# Patient Record
Sex: Female | Born: 1960 | Race: Black or African American | Hispanic: No | Marital: Married | Smoking: Never smoker
Health system: Southern US, Community
[De-identification: ages and names within clinical notes are randomized; demographics above are authoritative.]

## PROBLEM LIST (undated history)

## (undated) DIAGNOSIS — E119 Type 2 diabetes mellitus without complications: Secondary | ICD-10-CM

## (undated) DIAGNOSIS — I10 Essential (primary) hypertension: Secondary | ICD-10-CM

## (undated) DIAGNOSIS — R011 Cardiac murmur, unspecified: Secondary | ICD-10-CM

## (undated) DIAGNOSIS — K219 Gastro-esophageal reflux disease without esophagitis: Secondary | ICD-10-CM

## (undated) DIAGNOSIS — E049 Nontoxic goiter, unspecified: Secondary | ICD-10-CM

## (undated) HISTORY — PX: ABDOMINAL HYSTERECTOMY: SHX81

## (undated) HISTORY — DX: Gastro-esophageal reflux disease without esophagitis: K21.9

## (undated) HISTORY — PX: OVARIAN CYST REMOVAL: SHX89

## (undated) HISTORY — PX: BREAST CYST EXCISION: SHX579

## (undated) HISTORY — PX: LAPAROTOMY: SHX154

## (undated) HISTORY — PX: OOPHORECTOMY: SHX86

---

## 2004-11-07 ENCOUNTER — Ambulatory Visit: Payer: Self-pay | Admitting: Family Medicine

## 2005-04-25 ENCOUNTER — Ambulatory Visit: Payer: Self-pay | Admitting: Family Medicine

## 2006-01-04 ENCOUNTER — Ambulatory Visit: Payer: Self-pay | Admitting: Otolaryngology

## 2006-04-22 ENCOUNTER — Ambulatory Visit: Payer: Self-pay | Admitting: Gynecology

## 2006-10-01 ENCOUNTER — Ambulatory Visit: Payer: Self-pay | Admitting: Family Medicine

## 2007-08-27 ENCOUNTER — Ambulatory Visit: Payer: Self-pay | Admitting: Obstetrics & Gynecology

## 2007-09-04 ENCOUNTER — Encounter: Admission: RE | Admit: 2007-09-04 | Discharge: 2007-09-04 | Payer: Self-pay | Admitting: Gynecology

## 2007-09-12 ENCOUNTER — Ambulatory Visit: Payer: Self-pay | Admitting: Gynecology

## 2007-09-24 ENCOUNTER — Encounter: Admission: RE | Admit: 2007-09-24 | Discharge: 2007-09-24 | Payer: Self-pay | Admitting: Gynecology

## 2008-01-21 ENCOUNTER — Ambulatory Visit: Payer: Self-pay | Admitting: Unknown Physician Specialty

## 2008-03-24 ENCOUNTER — Encounter: Admission: RE | Admit: 2008-03-24 | Discharge: 2008-03-24 | Payer: Self-pay | Admitting: Gynecology

## 2008-08-26 ENCOUNTER — Ambulatory Visit: Payer: Self-pay | Admitting: Gastroenterology

## 2008-09-05 ENCOUNTER — Ambulatory Visit: Payer: Self-pay | Admitting: Gynecologic Oncology

## 2008-09-22 ENCOUNTER — Inpatient Hospital Stay: Payer: Self-pay | Admitting: Obstetrics & Gynecology

## 2008-09-29 ENCOUNTER — Ambulatory Visit: Payer: Self-pay | Admitting: Obstetrics & Gynecology

## 2008-10-02 ENCOUNTER — Inpatient Hospital Stay: Payer: Self-pay | Admitting: Obstetrics & Gynecology

## 2009-08-25 ENCOUNTER — Ambulatory Visit: Payer: Self-pay | Admitting: Obstetrics & Gynecology

## 2009-09-15 ENCOUNTER — Ambulatory Visit: Payer: Self-pay | Admitting: Surgery

## 2009-09-22 ENCOUNTER — Ambulatory Visit: Payer: Self-pay | Admitting: Surgery

## 2009-09-22 HISTORY — PX: BREAST BIOPSY: SHX20

## 2010-03-29 ENCOUNTER — Ambulatory Visit: Payer: Self-pay | Admitting: Surgery

## 2010-04-24 IMAGING — US US NEEDLE LOCALIZATION*L*
1 series · 6 of 6 positions shown · non-contrast
Comparison: none

REASON FOR EXAM: nodule
COMMENTS:

[Series 1: us needle localization*left* · 6 of 6 slices shown]
[im 1/6]
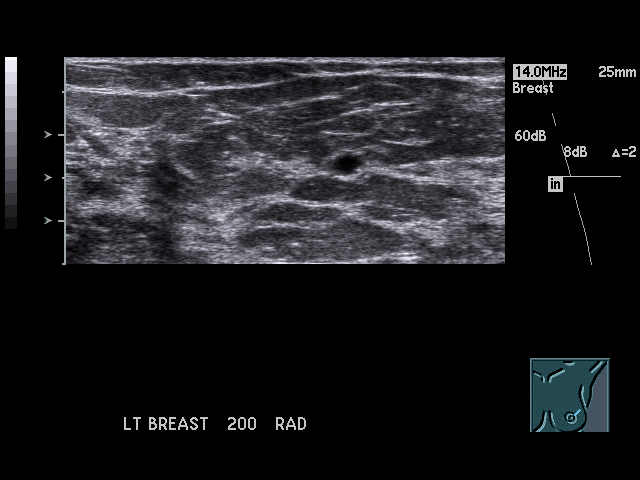
[im 2/6]
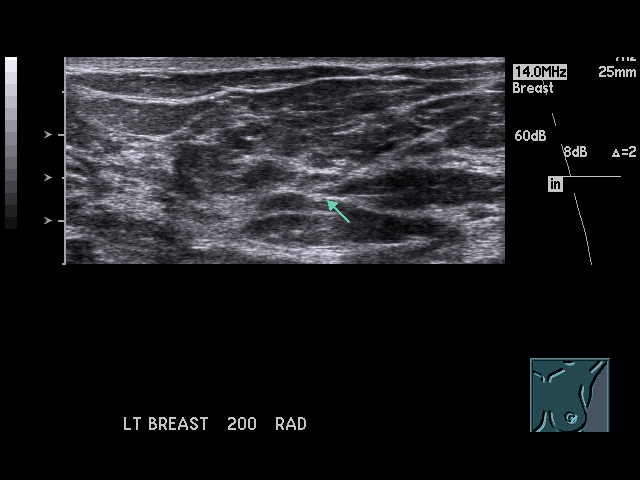
[im 3/6]
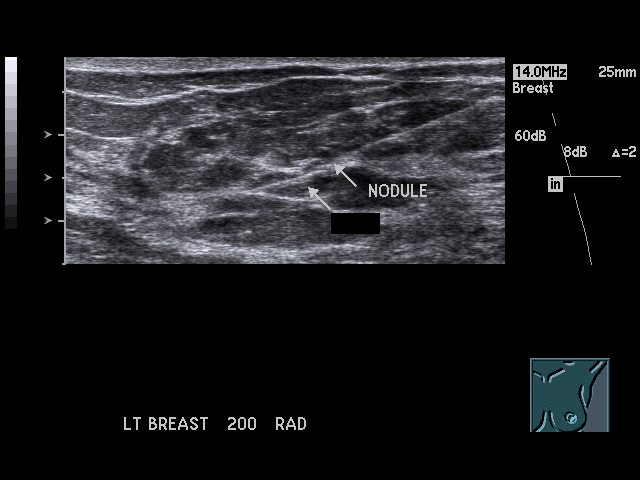
[im 4/6]
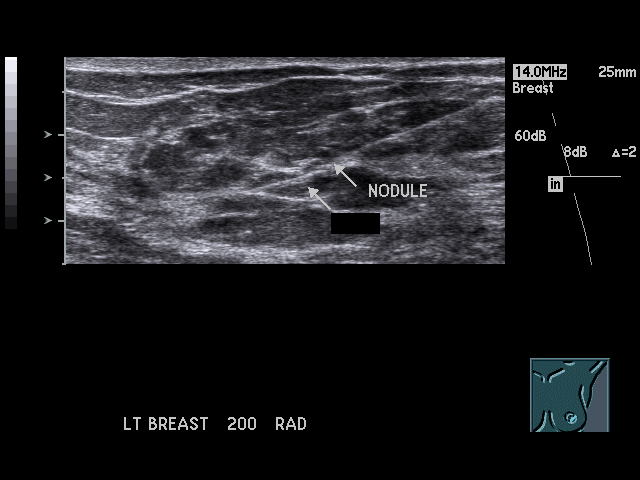
[im 5/6]
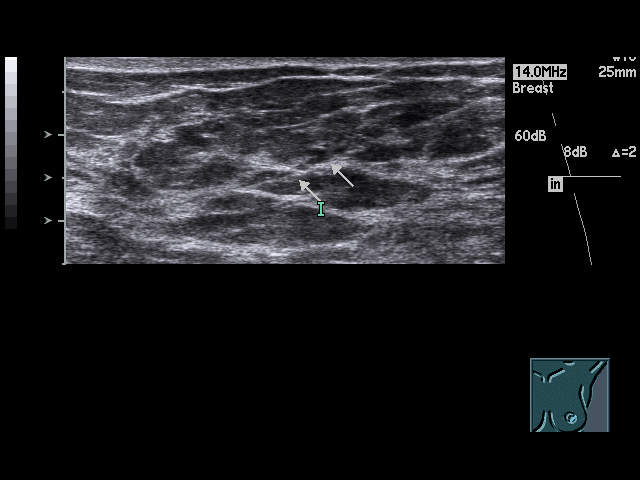
[im 6/6]
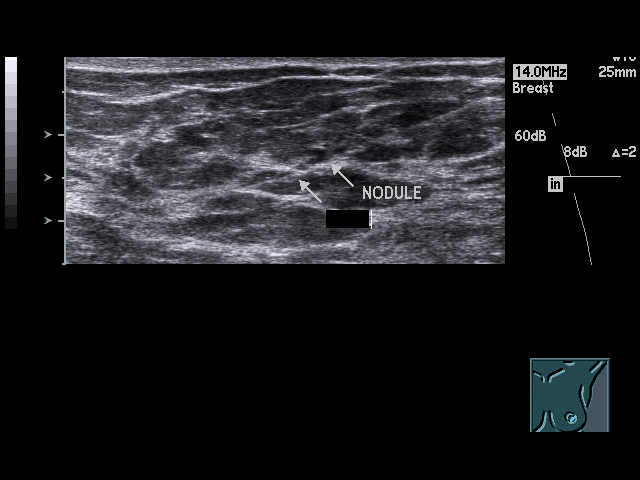

[6 of 6 positions shown; findings below may reference images not displayed]

PROCEDURE:     US  - US GUIDED NEEDLE LOCAL L BREAST  - September 22, 2009  [DATE]

RESULT:

The patient was informed of the risks and benefits of the procedure and
proper informed consent was obtained. The patient was brought to the
Ultrasound Suite and placed in a supine position. The left breast was
evaluated. A small hypoechoic nodule was once again identified. Proper entry
site for ultrasound-guided needle localization was established. The
overlying soft tissues were then prepped and draped in the usual sterile
fashion. Utilizing 4 ml of 1% lidocaine without epinephrine the overlying
soft tissues were anesthetized. Utilizing a 9 cm entry Kopans needle under
ultrasound guidance the hypoechoic nodule was cannulated. There is partial
collapse of the nodule status post cannulation likely representing a cystic
component. The overlying soft tissues were anesthetized with 4 ml of
lidocaine without epinephrine. A modified Kopans wire was placed through the
entry needle. Needle and wire placement was performed utilizing ultrasound
guidance. The entry needle was removed. The modified Kopans wire remained
with the hook just beyond the hypoechoic nodule and thickening within the
nodule. The patient tolerated the procedure without complications and was
then taken to surgery for biopsy.
IMPRESSION: Ultrasound guided needle localization as described above.
The patient tolerated the procedure without complications.

## 2010-06-23 ENCOUNTER — Emergency Department: Payer: Self-pay | Admitting: Emergency Medicine

## 2010-06-29 ENCOUNTER — Ambulatory Visit: Payer: Self-pay | Admitting: Family Medicine

## 2010-10-19 ENCOUNTER — Ambulatory Visit: Payer: Self-pay | Admitting: Obstetrics & Gynecology

## 2011-11-22 ENCOUNTER — Ambulatory Visit: Payer: Self-pay | Admitting: Obstetrics & Gynecology

## 2013-12-11 ENCOUNTER — Ambulatory Visit: Payer: Self-pay | Admitting: Obstetrics & Gynecology

## 2014-02-19 ENCOUNTER — Ambulatory Visit: Payer: Self-pay | Admitting: Gastroenterology

## 2014-02-22 LAB — PATHOLOGY REPORT

## 2014-03-09 ENCOUNTER — Ambulatory Visit: Payer: Self-pay

## 2014-04-05 ENCOUNTER — Ambulatory Visit: Payer: Self-pay

## 2014-05-24 ENCOUNTER — Ambulatory Visit: Payer: Self-pay | Admitting: Gastroenterology

## 2014-06-03 LAB — PATHOLOGY REPORT

## 2016-03-12 ENCOUNTER — Encounter: Payer: Self-pay | Admitting: *Deleted

## 2016-03-12 ENCOUNTER — Ambulatory Visit (INDEPENDENT_AMBULATORY_CARE_PROVIDER_SITE_OTHER): Payer: BLUE CROSS/BLUE SHIELD

## 2016-03-12 ENCOUNTER — Ambulatory Visit
Admission: EM | Admit: 2016-03-12 | Discharge: 2016-03-12 | Disposition: A | Discharge status: Home / Self Care | Payer: BLUE CROSS/BLUE SHIELD | Attending: Family Medicine | Admitting: Family Medicine

## 2016-03-12 DIAGNOSIS — S91342A Puncture wound with foreign body, left foot, initial encounter: Secondary | ICD-10-CM | POA: Diagnosis not present

## 2016-03-12 DIAGNOSIS — S91302A Unspecified open wound, left foot, initial encounter: Secondary | ICD-10-CM | POA: Diagnosis not present

## 2016-03-12 HISTORY — DX: Type 2 diabetes mellitus without complications: E11.9

## 2016-03-12 HISTORY — DX: Essential (primary) hypertension: I10

## 2016-03-12 MED ORDER — CEPHALEXIN 500 MG PO CAPS: 500.0000 mg | ORAL_CAPSULE | Freq: Three times a day (TID) | ORAL | Status: DC

## 2016-03-12 MED ORDER — TETANUS-DIPHTH-ACELL PERTUSSIS 5-2.5-18.5 LF-MCG/0.5 IM SUSP
0.5000 mL | Freq: Once | INTRAMUSCULAR | Status: AC
  Administered 2016-03-12: 0.5 mL via INTRAMUSCULAR

## 2016-03-12 NOTE — ED Provider Notes (Signed)
[facility]  ____________________________________________  Time seen: Approximately 11:49 AM  I have reviewed the triage vital signs and the nursing notes.   HISTORY  Chief Complaint Foot Injury   HPI April Webb is a 55 y.o. female presents with a complaint of possible piece of metal in the bottom of her left foot. Patient reports that this occurred on Saturday. Patient states that her husband usually wears work boots and works in a [employer] laboratory technician, and states that he walks to the house and his foods causing pieces of metal to be on the floor. Patient states that she then walked to the kitchen barefoot and felt a piece of what she assumed to be metal go into her foot. States that the metal pieces were very small and that this was a superficial injury. States there was no bleeding initially until she tried to search the wound for foreign body. Denies fall to the ground. Denies any other pain or injuries.  Patient states that she did clean the area multiple times with soap and water. Patient states that she used tweezers and felt like she pulled a small piece of metal out of her foot but concern that there may still be another piece. Patient states that the area that she pulled out was very small and does not feel like it when indeed. Again states that she was barefoot during this and reports that she is a diabetic.  States no pain to the foot at this time. States area directly around puncture site is minimally tender. Denies any drainage, fevers, pain radiation, none numbness or tingling sensation. Denies decreased range of motion. Denies any other pain or injury. Unsure of last tetanus immunization.  Past Medical History  Diagnosis Date  . Diabetes mellitus without complication ([place])   . Hypertension     There are no active problems to display for this patient.   Past Surgical History  Procedure Laterality Date  . Abdominal hysterectomy      Current Outpatient  Rx  Name  Route  Sig  Dispense  Refill  . hydrochlorothiazide (MICROZIDE) 12.5 MG capsule   Oral   Take 12.5 mg by mouth daily.         . metFORMIN (GLUCOPHAGE) 500 MG tablet   Oral   Take by mouth 2 (two) times daily with a meal.           Allergies Review of patient's allergies indicates no known allergies.  History reviewed. No pertinent family history.  Social History Social History  Substance Use Topics  . Smoking status: Never Smoker   . Smokeless tobacco: None  . Alcohol Use: No    Review of Systems Constitutional: No fever/chills Eyes: No visual changes. ENT: No sore throat. Cardiovascular: Denies chest pain. Respiratory: Denies shortness of breath. Gastrointestinal: No abdominal pain.  No nausea, no vomiting.  No diarrhea.  No constipation. Genitourinary: Negative for dysuria. Musculoskeletal: Negative for back pain. Skin: Negative for rash.As above. Neurological: Negative for headaches, focal weakness or numbness.  10-point ROS otherwise negative.  ____________________________________________   PHYSICAL EXAM:  VITAL SIGNS: ED Triage Vitals  Enc Vitals Group     BP 03/12/16 1043 149/78 mmHg     Pulse Rate 03/12/16 1043 79     Resp 03/12/16 1043 16     Temp 03/12/16 1043 98.1 F (36.7 C)     Temp Source 03/12/16 1043 Oral     SpO2 03/12/16 1043 100 %  Weight 03/12/16 1043 186 lb (84.369 kg)     Height 03/12/16 1043 5\' 6"  (1.676 m)     Head Cir --      Peak Flow --      Pain Score --      Pain Loc --      Pain Edu? --      Excl. in [place]? --     Constitutional: Alert and oriented. Well appearing and in no acute distress. Eyes: Conjunctivae are normal. PERRL. EOMI. Head: Atraumatic.  Nose: No congestion/rhinnorhea.  Mouth/Throat: Mucous membranes are moist.   Neck: No stridor.  No cervical spine tenderness to palpation. Cardiovascular: Normal rate, regular rhythm. Grossly normal heart sounds.  Good peripheral circulation. Respiratory:  Normal respiratory effort.  No retractions. Lungs CTAB. Gastrointestinal: Soft and nontender. Musculoskeletal: No lower or upper extremity tenderness nor edema. Bilateral pedal pulses equal and easily palpated. See skin below. Neurologic:  Normal speech and language. No gross focal neurologic deficits are appreciated. No gait instability. Skin:  Skin is warm, dry and intact. No rash noted. Except : Left plantar foot over the ball of the foot very small superficial-appearing blackish foreign body visualized, no surrounding erythema, minimal tenderness directly at site, no induration, no fluctuance, no drainage, no other tenderness, full range of motion to left foot, capillary refill less than 2 seconds to the left foot all distal toes. Steady gait. Bilateral lower extremities nontender to palpation except for minimally directly over foreign body site. Psychiatric: Mood and affect are normal. Speech and behavior are normal.  ____________________________________________   LABS (all labs ordered are listed, but only abnormal results are displayed)  Labs Reviewed - No data to display  RADIOLOGY   EXAM: LEFT FOOT - COMPLETE 3+ VIEW  COMPARISON: None.  FINDINGS: There is no visible foreign body in the soft tissues. 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  Small calcaneal enthesophytes. Minimal dorsal spurring in the midfoot. Anatomic variant of an os naviculare.  IMPRESSION: No visible foreign body. Arthritic changes.   Electronically Signed By: Lorriane Shire M.D. On: 03/12/2016 11:46  I, April Webb, personally viewed and evaluated these images (plain radiographs) as part of my medical decision making, as well as reviewing the written report by the radiologist.  ____________________________________________   PROCEDURES  Procedure(s) performed:   Procedure(s) performed:  Procedure  explained and verbal consent obtained. Consent: Verbal consent obtained. Written consent not obtained. Risks and benefits: risks, benefits and alternatives were discussed Patient identity confirmed: verbally with patient and hospital-assigned identification number  Consent given by: patient   Foreign body removal Location: Left plantar foot beneath first MCP Tendon involvement: none Nerve involvement: none Preparation: Patient was prepped and draped in the usual sterile fashion. Anesthesia: Patient denies anesthesia. Irrigation solution: saline and Betadine  Irrigation method: jet lavage Amount of cleaning: copious Sterile forceps and #11 blade used to make a very superficial incision made to remove small fragmented black foreign body approximately 1 mm Wound area then irrigated with saline again. Post removal and irrigation no further foreign bodies visualized. Patient reports tenderness to site immediately improved post foreign body removal. No repair of wound indicated. Antibiotic ointment and dressing applied.  Wound care instructions provided.  Observe for any signs of infection or other problems.     ____________________________________________   INITIAL IMPRESSION / ASSESSMENT AND PLAN / ED COURSE  Pertinent labs & imaging results that  were available during my care of the patient were reviewed by me and considered in my medical decision making (see chart for details).  Very well-appearing patient. No acute distress. Presents for complaint of concern of foreign body in left plantar foot. Left foot x-ray no visible foreign body, arthritic changes per radiologist. Patient had very small superficial foreign body present to left plantar foot without surrounding erythema, swelling or drainage. Small foreign body removed. Discussed keeping clean, topical antibiotic. Will treat patient with oral cephalexin and instructed to keep clean. Tetanus immunization updated.  Discussed follow up  with Primary care physician this week. Discussed follow up and return parameters including no resolution or any worsening concerns. Patient verbalized understanding and agreed to plan.   ____________________________________________   FINAL CLINICAL IMPRESSION(S) / ED DIAGNOSES  Final diagnoses:  Puncture wound of left foot with foreign body, initial encounter      Note: This dictation was prepared with Dragon dictation along with smaller phrase technology. Any transcriptional errors that result from this process are unintentional.    April Land, NP 03/12/16 1751

## 2016-03-12 NOTE — Discharge Instructions (Signed)
Take medication as prescribed. Keep clean. Apply topical antibiotic as discussed.   Follow up with your primary care physician this week as needed. Return to Urgent care for new or worsening concerns.    Puncture Wound A puncture wound is an injury that is caused by a sharp, thin object that goes through (penetrates) your skin. Usually, a puncture wound does not leave a large opening in your skin, so it may not bleed a lot. However, when you get a puncture wound, dirt or other materials (foreign bodies) can be forced into your wound and break off inside. This increases the chance of infection, such as tetanus. CAUSES Puncture wounds are caused by any sharp, thin object that goes through your skin, such as:  Animal teeth, as with an animal bite.  Sharp, pointed objects, such as nails, splinters of glass, fishhooks, and needles. SYMPTOMS Symptoms of a puncture wound include:  Pain.  Bleeding.  Swelling.  Bruising.  Fluid leaking from the wound.  Numbness, tingling, or loss of function. DIAGNOSIS This condition is diagnosed with a medical history and physical exam. Your wound will be checked to see if it contains any foreign bodies. You may also have X-rays or other imaging tests. TREATMENT Treatment for a puncture wound depends on how serious the wound is. It also depends on whether the wound contains any foreign bodies. Treatment for all types of puncture wounds usually starts with:  Controlling the bleeding.  Washing out the wound with a germ-free (sterile) salt-water solution.  Checking the wound for foreign bodies. Treatment may also include:  Having the wound opened surgically to remove a foreign object.  Closing the wound with stitches (sutures) if it continues to bleed.  Covering the wound with antibiotic ointments and a bandage (dressing).  Receiving a tetanus shot.  Receiving a rabies vaccine. HOME CARE INSTRUCTIONS Medicines  Take or apply over-the-counter  and prescription medicines only as told by your health care provider.  If you were prescribed an antibiotic, take or apply it as told by your health care provider. Do not stop using the antibiotic even if your condition improves. Wound Care  There are many ways to close and cover a wound. For example, a wound can be covered with sutures, skin glue, or adhesive strips. Follow instructions from your health care provider about:  How to take care of your wound.  When and how you should change your dressing.  When you should remove your dressing.  Removing whatever was used to close your wound.  Keep the dressing dry as told by your health care provider. Do not take baths, swim, use a hot tub, or do anything that would put your wound underwater until your health care provider approves.  Clean the wound as told by your health care provider.  Do not scratch or pick at the wound.  Check your wound every day for signs of infection. Watch for:  Redness, swelling, or pain.  Fluid, blood, or pus. General Instructions  Raise (elevate) the injured area above the level of your heart while you are sitting or lying down.  If your puncture wound is in your foot, ask your health care provider if you need to avoid putting weight on your foot and for how long.  Keep all follow-up visits as told by your health care provider. This is important. SEEK MEDICAL CARE IF:  You received a tetanus shot and you have swelling, severe pain, redness, or bleeding at the injection site.  You have  a fever.  Your sutures come out.  You notice a bad smell coming from your wound or your dressing.  You notice something coming out of your wound, such as wood or glass.  Your pain is not controlled with medicine.  You have increased redness, swelling, or pain at the site of your wound.  You have fluid, blood, or pus coming from your wound.  You notice a change in the color of your skin near your wound.  You  need to change the dressing frequently due to fluid, blood, or pus draining from your wound.  You develop a new rash.  You develop numbness around your wound. SEEK IMMEDIATE MEDICAL CARE IF:  You develop severe swelling around your wound.  Your pain suddenly increases and is severe.  You develop painful skin lumps.  You have a red streak going away from your wound.  The wound is on your hand or foot and you cannot properly move a finger or toe.  The wound is on your hand or foot and you notice that your fingers or toes look pale or bluish.   This information is not intended to replace advice given to you by your health care provider. Make sure you discuss any questions you have with your health care provider.   Document Released: 08/01/2005 Document Revised: 07/13/2015 Document Reviewed: 12/15/2014 Elsevier Interactive Patient Education 2016 [place] Taking care of your wound properly can help to prevent pain and infection. It can also help your wound to heal more quickly.  HOW TO CARE FOR YOUR WOUND  Take or apply over-the-counter and prescription medicines only as told by your health care provider.  If you were prescribed antibiotic medicine, take or apply it as told by your health care provider. Do not stop using the antibiotic even if your condition improves.  Clean the wound each day or as told by your health care provider.  Wash the wound with mild soap and water.  Rinse the wound with water to remove all soap.  Pat the wound dry with a clean towel. Do not rub it.  There are many different ways to close and cover a wound. For example, a wound can be covered with stitches (sutures), skin glue, or adhesive strips. Follow instructions from your health care provider about:  How to take care of your wound.  When and how you should change your bandage (dressing).  When you should remove your dressing.  Removing whatever was used to close your  wound.  Check your wound every day for signs of infection. Watch for:  Redness, swelling, or pain.  Fluid, blood, or pus.  Keep the dressing dry until your health care provider says it can be removed. Do not take baths, swim, use a hot tub, or do anything that would put your wound underwater until your health care provider approves.  Raise (elevate) the injured area above the level of your heart while you are sitting or lying down.  Do not scratch or pick at the wound.  Keep all follow-up visits as told by your health care provider. This is important. SEEK MEDICAL CARE IF:  You received a tetanus shot and you have swelling, severe pain, redness, or bleeding at the injection site.  You have a fever.  Your pain is not controlled with medicine.  You have increased redness, swelling, or pain at the site of your wound.  You have fluid, blood, or pus coming from your wound.  You  notice a bad smell coming from your wound or your dressing. SEEK IMMEDIATE MEDICAL CARE IF:  You have a red streak going away from your wound.   This information is not intended to replace advice given to you by your health care provider. Make sure you discuss any questions you have with your health care provider.   Document Released: 07/31/2008 Document Revised: 03/08/2015 Document Reviewed: 10/18/2014 Elsevier Interactive Patient Education [employer].

## 2016-03-12 NOTE — ED Notes (Signed)
Possible piece of metal in sole of left foot.

## 2016-04-17 DIAGNOSIS — M25541 Pain in joints of right hand: Secondary | ICD-10-CM | POA: Diagnosis not present

## 2016-04-27 DIAGNOSIS — N898 Other specified noninflammatory disorders of vagina: Secondary | ICD-10-CM | POA: Diagnosis not present

## 2016-04-27 DIAGNOSIS — N760 Acute vaginitis: Secondary | ICD-10-CM | POA: Diagnosis not present

## 2016-06-13 DIAGNOSIS — N760 Acute vaginitis: Secondary | ICD-10-CM | POA: Diagnosis not present

## 2016-06-13 DIAGNOSIS — N898 Other specified noninflammatory disorders of vagina: Secondary | ICD-10-CM | POA: Diagnosis not present

## 2017-02-28 ENCOUNTER — Telehealth: Payer: Self-pay | Admitting: Obstetrics & Gynecology

## 2017-02-28 DIAGNOSIS — E119 Type 2 diabetes mellitus without complications: Secondary | ICD-10-CM

## 2017-02-28 DIAGNOSIS — I10 Essential (primary) hypertension: Secondary | ICD-10-CM

## 2017-02-28 MED ORDER — METFORMIN HCL 500 MG PO TABS: 500.0000 mg | ORAL_TABLET | Freq: Two times a day (BID) | ORAL | 1 refills | Status: DC

## 2017-02-28 MED ORDER — HYDROCHLOROTHIAZIDE 12.5 MG PO CAPS: 12.5000 mg | ORAL_CAPSULE | Freq: Every day | ORAL | 1 refills | Status: DC

## 2017-02-28 NOTE — Telephone Encounter (Signed)
Patient scheduled her Annual for May 22, However she is in need of a refill on her bp meds.  Please advise pt.

## 2017-03-26 ENCOUNTER — Ambulatory Visit: Payer: Self-pay | Admitting: Obstetrics & Gynecology

## 2017-04-24 ENCOUNTER — Ambulatory Visit (INDEPENDENT_AMBULATORY_CARE_PROVIDER_SITE_OTHER): Payer: BLUE CROSS/BLUE SHIELD | Admitting: Obstetrics & Gynecology

## 2017-04-24 ENCOUNTER — Encounter: Payer: Self-pay | Admitting: Obstetrics & Gynecology

## 2017-04-24 VITALS — BP 130/80 | HR 74 | Ht 66.0 in | Wt 191.0 lb

## 2017-04-24 DIAGNOSIS — Z01419 Encounter for gynecological examination (general) (routine) without abnormal findings: Secondary | ICD-10-CM | POA: Diagnosis not present

## 2017-04-24 DIAGNOSIS — Z0000 Encounter for general adult medical examination without abnormal findings: Secondary | ICD-10-CM

## 2017-04-24 DIAGNOSIS — Z1239 Encounter for other screening for malignant neoplasm of breast: Secondary | ICD-10-CM

## 2017-04-24 DIAGNOSIS — Z1231 Encounter for screening mammogram for malignant neoplasm of breast: Secondary | ICD-10-CM | POA: Diagnosis not present

## 2017-04-24 DIAGNOSIS — N952 Postmenopausal atrophic vaginitis: Secondary | ICD-10-CM

## 2017-04-24 DIAGNOSIS — Z1211 Encounter for screening for malignant neoplasm of colon: Secondary | ICD-10-CM | POA: Diagnosis not present

## 2017-04-24 DIAGNOSIS — Z1329 Encounter for screening for other suspected endocrine disorder: Secondary | ICD-10-CM

## 2017-04-24 DIAGNOSIS — Z1321 Encounter for screening for nutritional disorder: Secondary | ICD-10-CM

## 2017-04-24 NOTE — Progress Notes (Signed)
HPI:      [name] is a 56 y.o. [phone] who LMP was in the past, she presents today for her annual examination.  The patient has no complaints today. The patient is sexually active. Herlast pap: approximate date 2016 and was normal and last mammogram: approximate date 2017 and was normal.  The patient does perform self breast exams.  There is no notable family history of breast or ovarian cancer in her family. The patient is not taking hormone replacement therapy. Patient denies post-menopausal vaginal bleeding.   The patient has regular exercise: yes. The patient denies current symptoms of depression.  Vag dryness and dyspareunia  GYN Hx: Last Colonoscopy:4 years ago. Normal.  Last DEXA: never ago.    PMHx: Past Medical History:  Diagnosis Date  . Diabetes mellitus without complication ([place])   . Hypertension    Past Surgical History:  Procedure Laterality Date  . ABDOMINAL HYSTERECTOMY    . CESAREAN SECTION    . LAPAROTOMY    . OOPHORECTOMY    . OVARIAN CYST REMOVAL     Family History  Problem Relation Age of Onset  . Colon cancer Mother   . Hypertension Father   . Prostate cancer Father    Social History  Substance Use Topics  . Smoking status: Never Smoker  . Smokeless tobacco: Never Used  . Alcohol use No    Current Outpatient Prescriptions:  .  glucose blood (ONETOUCH VERIO) test strip, Use once daily. Use as instructed., Disp: , Rfl:  .  hydrochlorothiazide (MICROZIDE) 12.5 MG capsule, Take 1 capsule (12.5 mg total) by mouth daily., Disp: 30 capsule, Rfl: 1 .  cephALEXin (KEFLEX) 500 MG capsule, Take 1 capsule (500 mg total) by mouth 3 (three) times daily. (Patient not taking: Reported on 04/24/2017), Disp: 21 capsule, Rfl: 0 .  metFORMIN (GLUCOPHAGE) 500 MG tablet, Take 1 tablet (500 mg total) by mouth 2 (two) times daily with a meal., Disp: 60 tablet, Rfl: 1 Allergies: Patient has no known allergies.  Review of Systems  Constitutional: Negative for  chills, fever and malaise/fatigue.  HENT: Negative for congestion, sinus pain and sore throat.   Eyes: Negative for blurred vision and pain.  Respiratory: Negative for cough and wheezing.   Cardiovascular: Negative for chest pain and leg swelling.  Gastrointestinal: Negative for abdominal pain, constipation, diarrhea, heartburn, nausea and vomiting.  Genitourinary: Negative for dysuria, frequency, hematuria and urgency.  Musculoskeletal: Negative for back pain, joint pain, myalgias and neck pain.  Skin: Negative for itching and rash.  Neurological: Negative for dizziness, tremors and weakness.  Endo/Heme/Allergies: Does not bruise/bleed easily.  Psychiatric/Behavioral: Negative for depression. The patient is not nervous/anxious and does not have insomnia.     Objective: BP 130/80   Pulse 74   Ht 5\' 6"  (1.676 m)   Wt 191 lb (86.6 kg)   BMI 30.83 kg/m   Filed Weights   04/24/17 0848  Weight: 191 lb (86.6 kg)   Body mass index is 30.83 kg/m. Physical Exam  Constitutional: She is oriented to person, place, and time. She appears well-developed and well-nourished. No distress.  Genitourinary: Rectum normal and vagina normal. Pelvic exam was performed with patient supine. There is no rash or lesion on the right labia. There is no rash or lesion on the left labia. Vagina exhibits no lesion. No bleeding in the vagina. Right adnexum does not display mass and does not display tenderness. Left adnexum does not display mass and does not display  tenderness.  Genitourinary Comments: Absent Uterus Absent cervix Vaginal cuff well healed, atrohy present  HENT:  Head: Normocephalic and atraumatic. Head is without laceration.  Right Ear: Hearing normal.  Left Ear: Hearing normal.  Nose: No epistaxis.  No foreign bodies.  Mouth/Throat: Uvula is midline, oropharynx is clear and moist and mucous membranes are normal.  Eyes: Pupils are equal, round, and reactive to light.  Neck: Normal range of  motion. Neck supple. No thyromegaly present.  Cardiovascular: Normal rate and regular rhythm.  Exam reveals no gallop and no friction rub.   No murmur heard. Pulmonary/Chest: Effort normal and breath sounds normal. No respiratory distress. She has no wheezes. Right breast exhibits no mass, no skin change and no tenderness. Left breast exhibits no mass, no skin change and no tenderness.  Abdominal: Soft. Bowel sounds are normal. She exhibits no distension. There is no tenderness. There is no rebound.  Musculoskeletal: Normal range of motion.  Neurological: She is alert and oriented to person, place, and time. No cranial nerve deficit.  Skin: Skin is warm and dry.  Psychiatric: She has a normal mood and affect. Judgment normal.  Vitals reviewed.   Assessment: Annual Exam 1. Annual physical exam   2. Screen for colon cancer   3. Encounter for vitamin deficiency screening   4. Screening for breast cancer   5. Vaginal atrophy   6. Screening for thyroid disorder     Plan:            1.  Cervical Screening-  Pap smear schedule reviewed with patient  2. Breast screening- Exam annually and mammogram scheduled  3. Colonoscopy every 5 years, Hemoccult testing after age 87  4. Labs Ordered today  5. Counseling for hormonal therapy: none.  Replens and other options for vag dryness discussed    F/U  Return in about 1 year (around 04/24/2018) for Annual.  Barnett Applebaum, MD, [facility], [facility] 04/24/2017  9:26 AM

## 2017-04-24 NOTE — Patient Instructions (Signed)
PAP every 5 years Mammogram every year Colonoscopy every 10 years Labs today  [place] for mammogram [phone]  REPLENS twice weekly for vaginal therapy

## 2017-04-25 ENCOUNTER — Encounter: Payer: Self-pay | Admitting: Obstetrics & Gynecology

## 2017-04-25 LAB — TSH: TSH: 1.18 u[IU]/mL (ref 0.450–4.500)

## 2017-04-25 LAB — VITAMIN B12: Vitamin B-12: 384 pg/mL (ref 232–1245)

## 2017-04-25 LAB — VITAMIN D 25 HYDROXY (VIT D DEFICIENCY, FRACTURES): Vit D, 25-Hydroxy: 10.4 ng/mL — ABNORMAL LOW (ref 30.0–100.0)

## 2017-05-03 DIAGNOSIS — Z1211 Encounter for screening for malignant neoplasm of colon: Secondary | ICD-10-CM | POA: Diagnosis not present

## 2017-05-04 LAB — PLEASE NOTE

## 2017-05-04 LAB — FECAL OCCULT BLOOD, IMMUNOCHEMICAL: Fecal Occult Bld: NEGATIVE

## 2017-05-06 ENCOUNTER — Other Ambulatory Visit: Payer: Self-pay | Admitting: Obstetrics & Gynecology

## 2017-05-06 MED ORDER — VITAMIN D (ERGOCALCIFEROL) 1.25 MG (50000 UNIT) PO CAPS: 50000.0000 [IU] | ORAL_CAPSULE | ORAL | 2 refills | Status: DC

## 2017-05-16 ENCOUNTER — Other Ambulatory Visit: Payer: Self-pay | Admitting: Obstetrics & Gynecology

## 2017-05-16 DIAGNOSIS — I10 Essential (primary) hypertension: Secondary | ICD-10-CM

## 2017-05-16 DIAGNOSIS — E119 Type 2 diabetes mellitus without complications: Secondary | ICD-10-CM

## 2017-05-16 MED ORDER — METFORMIN HCL 500 MG PO TABS: 500.0000 mg | ORAL_TABLET | Freq: Two times a day (BID) | ORAL | 11 refills | Status: DC

## 2017-05-16 NOTE — Telephone Encounter (Signed)
What about the Metformin?

## 2017-05-16 NOTE — Telephone Encounter (Signed)
Pt is calling needing an refill on her Hydrochlorothiazide. 12.5 MG. [facility] in [place],

## 2017-05-16 NOTE — Telephone Encounter (Signed)
Approved Medications  hydrochlorothiazide (MICROZIDE) 12.5 MG capsule TAKE 1 CAPSULE BY MOUTH ONCE DAILY     Disp: 90 capsule    Refills: 3 (Pharmacy requested: 1)      Class: Normal Start: 05/16/2017  For: Hypertension, unspecified type Approved by: Gae Dry, MD To pharmacy: Please consider 90 day supplies to promote better adherence To be filled at: [facility] [address], [place]: [phone] Medication Refill   Gae Dry, MD routed conversation to You 12 minutes ago (10:07 AM)    You routed conversation to Gae Dry, MD 1 hour ago (8:55 AM)    Interface, Surescripts Out routed conversation to [employer] [facility] Refill 1 hour ago (8:30 AM)

## 2017-05-16 NOTE — Telephone Encounter (Signed)
Whats the question? I approved HCTZ this morning to her pharmacy.

## 2017-05-16 NOTE — Telephone Encounter (Signed)
That was not requested form the pharmacy.  Will see about re-prescribing now.

## 2017-05-31 ENCOUNTER — Other Ambulatory Visit: Payer: Self-pay

## 2017-10-17 ENCOUNTER — Ambulatory Visit
Admission: RE | Admit: 2017-10-17 | Discharge: 2017-10-17 | Disposition: A | Discharge status: Home / Self Care | Payer: BLUE CROSS/BLUE SHIELD | Source: Ambulatory Visit | Attending: Obstetrics & Gynecology | Admitting: Obstetrics & Gynecology

## 2017-10-17 DIAGNOSIS — Z1239 Encounter for other screening for malignant neoplasm of breast: Secondary | ICD-10-CM

## 2017-10-17 DIAGNOSIS — Z1231 Encounter for screening mammogram for malignant neoplasm of breast: Secondary | ICD-10-CM | POA: Insufficient documentation

## 2017-11-04 ENCOUNTER — Other Ambulatory Visit: Payer: Self-pay | Admitting: *Deleted

## 2017-11-04 ENCOUNTER — Inpatient Hospital Stay
Admission: RE | Admit: 2017-11-04 | Discharge: 2017-11-04 | Disposition: A | Discharge status: Home / Self Care | Payer: Self-pay | Source: Ambulatory Visit | Attending: *Deleted | Admitting: *Deleted

## 2017-11-04 DIAGNOSIS — Z9289 Personal history of other medical treatment: Secondary | ICD-10-CM

## 2018-05-15 ENCOUNTER — Other Ambulatory Visit: Payer: Self-pay

## 2018-05-15 ENCOUNTER — Encounter: Payer: Self-pay | Admitting: Emergency Medicine

## 2018-05-15 ENCOUNTER — Ambulatory Visit
Admission: EM | Admit: 2018-05-15 | Discharge: 2018-05-15 | Disposition: A | Discharge status: Home / Self Care | Payer: BLUE CROSS/BLUE SHIELD | Attending: Family Medicine | Admitting: Family Medicine

## 2018-05-15 DIAGNOSIS — Z800 Family history of malignant neoplasm of digestive organs: Secondary | ICD-10-CM | POA: Diagnosis not present

## 2018-05-15 DIAGNOSIS — M7522 Bicipital tendinitis, left shoulder: Secondary | ICD-10-CM | POA: Diagnosis not present

## 2018-05-15 DIAGNOSIS — Z7984 Long term (current) use of oral hypoglycemic drugs: Secondary | ICD-10-CM | POA: Insufficient documentation

## 2018-05-15 DIAGNOSIS — Z8249 Family history of ischemic heart disease and other diseases of the circulatory system: Secondary | ICD-10-CM | POA: Diagnosis not present

## 2018-05-15 DIAGNOSIS — Z79899 Other long term (current) drug therapy: Secondary | ICD-10-CM | POA: Insufficient documentation

## 2018-05-15 DIAGNOSIS — M79602 Pain in left arm: Secondary | ICD-10-CM

## 2018-05-15 DIAGNOSIS — Z8042 Family history of malignant neoplasm of prostate: Secondary | ICD-10-CM | POA: Insufficient documentation

## 2018-05-15 DIAGNOSIS — R51 Headache: Secondary | ICD-10-CM | POA: Insufficient documentation

## 2018-05-15 DIAGNOSIS — G501 Atypical facial pain: Secondary | ICD-10-CM

## 2018-05-15 DIAGNOSIS — Z791 Long term (current) use of non-steroidal anti-inflammatories (NSAID): Secondary | ICD-10-CM | POA: Diagnosis not present

## 2018-05-15 DIAGNOSIS — E119 Type 2 diabetes mellitus without complications: Secondary | ICD-10-CM | POA: Insufficient documentation

## 2018-05-15 DIAGNOSIS — K219 Gastro-esophageal reflux disease without esophagitis: Secondary | ICD-10-CM | POA: Diagnosis not present

## 2018-05-15 DIAGNOSIS — I10 Essential (primary) hypertension: Secondary | ICD-10-CM | POA: Insufficient documentation

## 2018-05-15 DIAGNOSIS — M79632 Pain in left forearm: Secondary | ICD-10-CM | POA: Diagnosis not present

## 2018-05-15 MED ORDER — MELOXICAM 15 MG PO TABS: 15.0000 mg | ORAL_TABLET | Freq: Every day | ORAL | 0 refills | Status: DC

## 2018-05-15 MED ORDER — PANTOPRAZOLE SODIUM 40 MG PO TBEC: 40.0000 mg | DELAYED_RELEASE_TABLET | Freq: Every day | ORAL | 0 refills | Status: DC

## 2018-05-15 NOTE — ED Triage Notes (Signed)
Patient c/o left sided facial and left arm pain that started last night.  Patient denies chest pain or SOB.

## 2018-05-15 NOTE — ED Provider Notes (Signed)
[facility]    CSN: [id number] Arrival date & time: 05/15/18  1538     History   Chief Complaint Chief Complaint  Patient presents with  . Arm Pain    left  . Facial Pain    HPI April Webb is a 57 y.o. female.   HPI  57 year old female diabetic hypertensive presents with left-sided facial and left arm pain that started last night.  She denies any chest pain or shortness of breath.  Accompanied by her husband.  Dates that the night before last she was awakened with a feeling that she closing of her windpipe but was able to sit up and cough of sputum.  She thought that this may have been from GERD.  She had gone to bed early after eating a spicy meal.  She has a history of H. pylori that was treated successfully in the past.  Dates that last night this did not occur but she was sleeping on more elevation.  Today she started having headache-like symptoms on the left side of her face involving her ear and her neck.  She had no slurring of speech had no facial weakness no visual disturbances.  States that that the eventually subsided as the day progressed.  Then she noticed pain over her left shoulder in the pectoralis muscle.  She states that there was tender to the touch indicating over the coracoid.  Present time it does not her bother her like it did earlier in the day.  She remembers no injury or any increase activities that may account for the symptoms.        Past Medical History:  Diagnosis Date  . Diabetes mellitus without complication ([place])   . Hypertension     Patient Active Problem List   Diagnosis Date Noted  . Vaginal atrophy 04/24/2017    Past Surgical History:  Procedure Laterality Date  . ABDOMINAL HYSTERECTOMY    . BREAST BIOPSY Left 09/22/2009   us/ bx- neg  . BREAST CYST EXCISION Left    neg  . CESAREAN SECTION    . LAPAROTOMY    . OOPHORECTOMY    . OVARIAN CYST REMOVAL      OB History    Gravida  3   Para  3   Term  3   Preterm      AB      Living  3     SAB      TAB      Ectopic      Multiple      Live Births               Home Medications    Prior to Admission medications   Medication Sig Start Date End Date Taking? Authorizing Provider  hydrochlorothiazide (MICROZIDE) 12.5 MG capsule TAKE 1 CAPSULE BY MOUTH ONCE DAILY 05/16/17  Yes Gae Dry, MD  metFORMIN (GLUCOPHAGE) 500 MG tablet Take 1 tablet (500 mg total) by mouth 2 (two) times daily with a meal. 05/16/17  Yes Gae Dry, MD  glucose blood (ONETOUCH VERIO) test strip Use once daily. Use as instructed. 07/07/14   [provider]  meloxicam (MOBIC) 15 MG tablet Take 1 tablet (15 mg total) by mouth daily. 05/15/18   Lorin Picket, PA-C  pantoprazole (PROTONIX) 40 MG tablet Take 1 tablet (40 mg total) by mouth daily. 05/15/18   Lorin Picket, PA-C    Family History Family History  Problem Relation Age  of Onset  . Colon cancer Mother   . Hypertension Father   . Prostate cancer Father   . Breast cancer Neg Hx     Social History Social History   Tobacco Use  . Smoking status: Never Smoker  . Smokeless tobacco: Never Used  Substance Use Topics  . Alcohol use: No  . Drug use: No     Allergies   Patient has no known allergies.   Review of Systems Review of Systems  Constitutional: Negative for activity change, appetite change, chills, fatigue and fever.  Respiratory: Positive for cough, choking and shortness of breath.   All other systems reviewed and are negative.    Physical Exam Triage Vital Signs ED Triage Vitals  Enc Vitals Group     BP 05/15/18 1553 (!) 150/76     Pulse Rate 05/15/18 1553 80     Resp 05/15/18 1553 16     Temp 05/15/18 1553 98.1 F (36.7 C)     Temp Source 05/15/18 1553 Oral     SpO2 --      Weight 05/15/18 1550 185 lb (83.9 kg)     Height 05/15/18 1550 5\' 6"  (1.676 m)     Head Circumference --      Peak Flow --      Pain Score 05/15/18 1550 5     Pain Loc  --      Pain Edu? --      Excl. in [place]? --    No data found.  Updated Vital Signs BP (!) 150/76 (BP Location: Left Arm)   Pulse 80   Temp 98.1 F (36.7 C) (Oral)   Resp 16   Ht 5\' 6"  (1.676 m)   Wt 185 lb (83.9 kg)   BMI 29.86 kg/m   Visual Acuity Right Eye Distance:   Left Eye Distance:   Bilateral Distance:    Right Eye Near:   Left Eye Near:    Bilateral Near:     Physical Exam  Constitutional: She is oriented to person, place, and time. She appears well-developed and well-nourished. No distress.  HENT:  Head: Normocephalic.  Right Ear: External ear normal.  Left Ear: External ear normal.  Nose: Nose normal.  Mouth/Throat: Oropharynx is clear and moist. No oropharyngeal exudate.  Eyes: Pupils are equal, round, and reactive to light. Right eye exhibits no discharge. Left eye exhibits no discharge.  Neck: Normal range of motion.  Cardiovascular: Normal rate, regular rhythm and normal heart sounds.  Pulmonary/Chest: Effort normal and breath sounds normal. No stridor. No respiratory distress. She has no wheezes. She has no rales. She exhibits tenderness.  Musculoskeletal: Normal range of motion. She exhibits tenderness.  Examination of the left shoulder shows good range of motion that is fairly comfortable.  Is a negative empty can test and negative arm drop test.  Does have tenderness over the coracoid process reproduces her symptoms.  There is also tenderness over the [facility] joint there is no crepitus.  Icing of the biceps does not cause her pain.  Is able to hold against resistance.  Lymphadenopathy:    She has no cervical adenopathy.  Neurological: She is alert and oriented to person, place, and time.  Skin: Skin is warm and dry. She is not diaphoretic.  Psychiatric: She has a normal mood and affect. Her behavior is normal. Judgment and thought content normal.  Nursing note and vitals reviewed.    UC Treatments / Results  Labs (all labs ordered are listed,  but only  abnormal results are displayed) Labs Reviewed - No data to display  EKG ED ECG REPORT   Date: 05/15/2018  EKG Time: 5:07 PM  Rate:83 Rhythm: normal sinus rhythm, normal sinus rhythm, unchanged from previous tracings 09/15/2009  Axis:normal Intervals:none  ST&T Change: No acute changes seen  Narrative Interpretation: Normal sinus rhythm Q waves present in 3 and aVF unchanged from previous EKG of 09/15/2009             Radiology No results found.  Procedures Procedures (including critical care time)  Medications Ordered in UC Medications - No data to display  Initial Impression / Assessment and Plan / UC Course  I have reviewed the triage vital signs and the nursing notes.  Pertinent labs & imaging results that were available during my care of the patient were reviewed by me and considered in my medical decision making (see chart for details).     Plan: 1. Test/x-ray results and diagnosis reviewed with patient 2. rx as per orders; risks, benefits, potential side effects reviewed with patient 3. Recommend supportive treatment with use of Protonix the next 2 weeks.  This was also suggested along with elevating the head of the bed 10 to 15 degrees at nighttime and not going to bed immediately after eating food.  For the shoulder I will prescribe the Mobic 50 mg on a daily basis to be taken with the Protonix.  Also take this with food.  I recommended that she should follow-up with her primary care physician or go to the emergency room if her symptoms worsen. 4. F/u prn if symptoms worsen or don't improve  Final Clinical Impressions(s) / UC Diagnoses   Final diagnoses:  Gastroesophageal reflux disease, esophagitis presence not specified  Biceps tendonitis on left   Discharge Instructions   None    ED Prescriptions    Medication Sig Dispense Auth. Provider   meloxicam (MOBIC) 15 MG tablet Take 1 tablet (15 mg total) by mouth daily. 30 tablet Crecencio Mc P, PA-C    pantoprazole (PROTONIX) 40 MG tablet Take 1 tablet (40 mg total) by mouth daily. 30 tablet Lorin Picket, PA-C     Controlled Substance Prescriptions [place] Controlled Substance Registry consulted? Not Applicable   Juanda Crumble 05/15/18 1904

## 2018-06-16 ENCOUNTER — Other Ambulatory Visit: Payer: Self-pay | Admitting: Obstetrics & Gynecology

## 2018-06-16 DIAGNOSIS — I10 Essential (primary) hypertension: Secondary | ICD-10-CM

## 2018-06-16 NOTE — Telephone Encounter (Signed)
Pt is going out of town tomorrow

## 2018-06-17 ENCOUNTER — Other Ambulatory Visit: Payer: Self-pay | Admitting: Obstetrics & Gynecology

## 2018-06-17 DIAGNOSIS — I10 Essential (primary) hypertension: Secondary | ICD-10-CM

## 2018-06-17 MED ORDER — HYDROCHLOROTHIAZIDE 12.5 MG PO CAPS: 12.5000 mg | ORAL_CAPSULE | Freq: Every day | ORAL | 0 refills | Status: DC

## 2018-06-17 NOTE — Telephone Encounter (Signed)
ERx done

## 2018-06-17 NOTE — Telephone Encounter (Signed)
Pt needs refill on BP meds, enough to last until her appointment wit you on 07-11-18 please advise

## 2018-07-11 ENCOUNTER — Encounter: Payer: Self-pay | Admitting: Obstetrics & Gynecology

## 2018-07-11 ENCOUNTER — Other Ambulatory Visit (HOSPITAL_COMMUNITY)
Admission: RE | Admit: 2018-07-11 | Discharge: 2018-07-11 | Disposition: A | Discharge status: Home / Self Care | Payer: BLUE CROSS/BLUE SHIELD | Source: Ambulatory Visit | Attending: Obstetrics & Gynecology | Admitting: Obstetrics & Gynecology

## 2018-07-11 ENCOUNTER — Ambulatory Visit (INDEPENDENT_AMBULATORY_CARE_PROVIDER_SITE_OTHER): Payer: BLUE CROSS/BLUE SHIELD | Admitting: Obstetrics & Gynecology

## 2018-07-11 VITALS — BP 130/80 | Ht 66.0 in | Wt 186.0 lb

## 2018-07-11 DIAGNOSIS — Z1272 Encounter for screening for malignant neoplasm of vagina: Secondary | ICD-10-CM

## 2018-07-11 DIAGNOSIS — Z1151 Encounter for screening for human papillomavirus (HPV): Secondary | ICD-10-CM | POA: Diagnosis not present

## 2018-07-11 DIAGNOSIS — Z0000 Encounter for general adult medical examination without abnormal findings: Secondary | ICD-10-CM

## 2018-07-11 DIAGNOSIS — Z90710 Acquired absence of both cervix and uterus: Secondary | ICD-10-CM | POA: Insufficient documentation

## 2018-07-11 DIAGNOSIS — Z1231 Encounter for screening mammogram for malignant neoplasm of breast: Secondary | ICD-10-CM | POA: Diagnosis not present

## 2018-07-11 DIAGNOSIS — Z01419 Encounter for gynecological examination (general) (routine) without abnormal findings: Secondary | ICD-10-CM

## 2018-07-11 DIAGNOSIS — Z1211 Encounter for screening for malignant neoplasm of colon: Secondary | ICD-10-CM | POA: Diagnosis not present

## 2018-07-11 DIAGNOSIS — E119 Type 2 diabetes mellitus without complications: Secondary | ICD-10-CM

## 2018-07-11 DIAGNOSIS — Z1321 Encounter for screening for nutritional disorder: Secondary | ICD-10-CM

## 2018-07-11 DIAGNOSIS — Z1239 Encounter for other screening for malignant neoplasm of breast: Secondary | ICD-10-CM

## 2018-07-11 MED ORDER — METFORMIN HCL 500 MG PO TABS: 500.0000 mg | ORAL_TABLET | Freq: Two times a day (BID) | ORAL | 4 refills | Status: DC

## 2018-07-11 NOTE — Progress Notes (Signed)
HPI:      [name] is a 57 y.o. [phone] who LMP was in the past due to hysterectomy, she presents today for her annual examination.  The patient has no complaints today. The patient is sexually active. Herlast pap: approximate date 2016 and was normal and last mammogram: approximate date 2018 (Dec) and was normal.  The patient does perform self breast exams.  There is no notable family history of breast or ovarian cancer in her family. The patient is not taking hormone replacement therapy. Patient denies post-menopausal vaginal bleeding.   The patient has regular exercise: yes. The patient denies current symptoms of depression.  Still having some vaginal dryness, Replens has helped when she uses it.  GYN Hx: Last Colonoscopy:5 years ago. Normal.  Vit D level low last year, took Rx for 3 mos, does not take OTC Vit D  PMHx: Past Medical History:  Diagnosis Date  . Diabetes mellitus without complication ([place])   . Hypertension    Past Surgical History:  Procedure Laterality Date  . ABDOMINAL HYSTERECTOMY    . BREAST BIOPSY Left 09/22/2009   us/ bx- neg  . BREAST CYST EXCISION Left    neg  . CESAREAN SECTION    . LAPAROTOMY    . OOPHORECTOMY    . OVARIAN CYST REMOVAL     Family History  Problem Relation Age of Onset  . Colon cancer Mother   . Hypertension Father   . Prostate cancer Father   . Breast cancer Neg Hx    Social History   Tobacco Use  . Smoking status: Never Smoker  . Smokeless tobacco: Never Used  Substance Use Topics  . Alcohol use: No  . Drug use: No    Current Outpatient Medications:  .  glucose blood (ONETOUCH VERIO) test strip, Use once daily. Use as instructed., Disp: , Rfl:  .  hydrochlorothiazide (MICROZIDE) 12.5 MG capsule, Take 1 capsule (12.5 mg total) by mouth daily., Disp: 90 capsule, Rfl: 0 .  metFORMIN (GLUCOPHAGE) 500 MG tablet, Take 1 tablet (500 mg total) by mouth 2 (two) times daily with a meal., Disp: 60 tablet, Rfl: 11 .   meloxicam (MOBIC) 15 MG tablet, Take 1 tablet (15 mg total) by mouth daily. (Patient not taking: Reported on 07/11/2018), Disp: 30 tablet, Rfl: 0 .  pantoprazole (PROTONIX) 40 MG tablet, Take 1 tablet (40 mg total) by mouth daily. (Patient not taking: Reported on 07/11/2018), Disp: 30 tablet, Rfl: 0 Allergies: Patient has no known allergies.  Review of Systems  Constitutional: Negative for chills, fever and malaise/fatigue.  HENT: Negative for congestion, sinus pain and sore throat.   Eyes: Negative for blurred vision and pain.  Respiratory: Negative for cough and wheezing.   Cardiovascular: Negative for chest pain and leg swelling.  Gastrointestinal: Negative for abdominal pain, constipation, diarrhea, heartburn, nausea and vomiting.  Genitourinary: Negative for dysuria, frequency, hematuria and urgency.  Musculoskeletal: Negative for back pain, joint pain, myalgias and neck pain.  Skin: Negative for itching and rash.  Neurological: Negative for dizziness, tremors and weakness.  Endo/Heme/Allergies: Does not bruise/bleed easily.  Psychiatric/Behavioral: Negative for depression. The patient is not nervous/anxious and does not have insomnia.    Objective: BP 130/80   Ht 5\' 6"  (1.676 m)   Wt 186 lb (84.4 kg)   BMI 30.02 kg/m   Filed Weights   07/11/18 0757  Weight: 186 lb (84.4 kg)   Body mass index is 30.02 kg/m. Physical Exam  Constitutional:  She is oriented to person, place, and time. She appears well-developed and well-nourished. No distress.  Genitourinary: Rectum normal and vagina normal. Pelvic exam was performed with patient supine. There is no rash or lesion on the right labia. There is no rash or lesion on the left labia. Vagina exhibits no lesion. No bleeding in the vagina. Right adnexum does not display mass and does not display tenderness. Left adnexum does not display mass and does not display tenderness.  Genitourinary Comments: Absent Uterus Absent cervix Vaginal cuff well  healed  HENT:  Head: Normocephalic and atraumatic. Head is without laceration.  Right Ear: Hearing normal.  Left Ear: Hearing normal.  Nose: No epistaxis.  No foreign bodies.  Mouth/Throat: Uvula is midline, oropharynx is clear and moist and mucous membranes are normal.  Eyes: Pupils are equal, round, and reactive to light.  Neck: Normal range of motion. Neck supple. No thyromegaly present.  Cardiovascular: Normal rate and regular rhythm. Exam reveals no gallop and no friction rub.  No murmur heard. Pulmonary/Chest: Effort normal and breath sounds normal. No respiratory distress. She has no wheezes. Right breast exhibits no mass, no skin change and no tenderness. Left breast exhibits no mass, no skin change and no tenderness.  Abdominal: Soft. Bowel sounds are normal. She exhibits no distension. There is no tenderness. There is no rebound.  Musculoskeletal: Normal range of motion.  Neurological: She is alert and oriented to person, place, and time. No cranial nerve deficit.  Skin: Skin is warm and dry.  Psychiatric: She has a normal mood and affect. Judgment normal.  Vitals reviewed.  Assessment: Annual Exam 1. Annual physical exam   2. Screening for cervical cancer   3. Screening for breast cancer   4. Screen for colon cancer    Plan:            1.  Vaginal Screening-  Pap smear done today  2. Breast screening- Exam annually and mammogram scheduled  3. Colonoscopy every 5 years and will schedule soon, Hemoccult testing after age 54  4. Labs : Vit D recheck today.  Other labs thru work/PCP  5. Counseling for hormonal therapy: none. Replens for vaginal atrophy Alternatives discussed if worsens (dryness, dyspareunia)  6. Flu shot at work  7. Refill Metformin ([facility])    F/U  Return in about 1 year (around 07/12/2019) for Annual.  Barnett Applebaum, MD, [facility], [facility] 07/11/2018  8:02 AM

## 2018-07-11 NOTE — Patient Instructions (Addendum)
PAP every three years, done today Mammogram every year, due December 2019    Call [phone] to schedule at [facility] Colonoscopy every 5 years, due soon Labs yearly (with PCP) and Vitamin D level today

## 2018-07-12 LAB — VITAMIN D 25 HYDROXY (VIT D DEFICIENCY, FRACTURES): VIT D 25 HYDROXY: 12.9 ng/mL — AB (ref 30.0–100.0)

## 2018-07-14 ENCOUNTER — Other Ambulatory Visit: Payer: Self-pay | Admitting: Obstetrics & Gynecology

## 2018-07-14 MED ORDER — VITAMIN D (ERGOCALCIFEROL) 1.25 MG (50000 UNIT) PO CAPS: 50000.0000 [IU] | ORAL_CAPSULE | ORAL | 2 refills | Status: DC

## 2018-07-14 NOTE — Progress Notes (Signed)
Vit D level low again    eRx for Vit D 50,000 U weekly    Then Daily 1000 U OTC vit D  Barnett Applebaum, MD, [facility] 07/14/2018  7:39 AM

## 2018-07-15 LAB — CYTOLOGY - PAP
Diagnosis: NEGATIVE
HPV: NOT DETECTED

## 2018-08-27 DIAGNOSIS — Z23 Encounter for immunization: Secondary | ICD-10-CM | POA: Diagnosis not present

## 2018-09-09 ENCOUNTER — Telehealth: Payer: Self-pay

## 2018-09-09 ENCOUNTER — Other Ambulatory Visit: Payer: Self-pay | Admitting: Obstetrics & Gynecology

## 2018-09-09 DIAGNOSIS — Z1239 Encounter for other screening for malignant neoplasm of breast: Secondary | ICD-10-CM

## 2018-09-09 DIAGNOSIS — N644 Mastodynia: Secondary | ICD-10-CM

## 2018-09-09 NOTE — Telephone Encounter (Signed)
Pt needs order for mammogram.  When she called they asked her questions and she did tell them her left breast is sore.  They told her she would need to get an order from [place].  [phone]

## 2018-09-09 NOTE — Telephone Encounter (Signed)
Orders changed, she can schedule

## 2018-09-09 NOTE — Telephone Encounter (Signed)
Dx [facility] and [place] ordered

## 2018-09-10 ENCOUNTER — Encounter: Payer: Self-pay | Admitting: Obstetrics & Gynecology

## 2018-09-10 ENCOUNTER — Ambulatory Visit (INDEPENDENT_AMBULATORY_CARE_PROVIDER_SITE_OTHER): Payer: BLUE CROSS/BLUE SHIELD | Admitting: Obstetrics & Gynecology

## 2018-09-10 VITALS — BP 130/80 | Ht 66.0 in | Wt 185.0 lb

## 2018-09-10 DIAGNOSIS — N644 Mastodynia: Secondary | ICD-10-CM

## 2018-09-10 DIAGNOSIS — Z1321 Encounter for screening for nutritional disorder: Secondary | ICD-10-CM | POA: Diagnosis not present

## 2018-09-10 NOTE — Telephone Encounter (Signed)
Patient was seen 09/10/18 @ 2:30pm w/ Dr Kenton Kingfisher for a breast exam, and okay per Dr Kenton Kingfisher for a screening mammogram. Patient is aware of appointment at [facility] on Monday, 10/20/18 @ 2:00pm.

## 2018-09-10 NOTE — Progress Notes (Signed)
  HPI:      [name] is a 57 y.o. [phone] who is postmenopausal, presents today for a problem visit.  She complains of breast tenderness on the leftside which she first noticed several days ago.  It has decreased.  Associated symptoms include none.  Denies nipple discharge or skin changes.  No fever. She has had prior [facility] and recently stopped Vit E which is when pain developed.  Now pain is gone again as she is back on Vit E.  She also tries to restrict caffeine.  Prior Mammogram: 2018 normal. Prior breast problems: Yes Family History: Breast Cancer-relatedfamily history is negative for Breast cancer.  PMHx: She  has a past medical history of Diabetes mellitus without complication ([place]) and Hypertension. Also,  has a past surgical history that includes Abdominal hysterectomy; Cesarean section; Oophorectomy; laparotomy; Ovarian cyst removal; Breast cyst excision (Left); and Breast biopsy (Left, 09/22/2009)., family history includes Colon cancer in her mother; Hypertension in her father; Prostate cancer in her father.,  reports that she has never smoked. She has never used smokeless tobacco. She reports that she does not drink alcohol or use drugs.  She has a current medication list which includes the following prescription(s): glucose blood, hydrochlorothiazide, meloxicam, metformin, pantoprazole, and vitamin d (ergocalciferol). Also, has No Known Allergies.  Review of Systems  All other systems reviewed and are negative.   Objective: BP 130/80   Ht 5\' 6"  (1.676 m)   Wt 185 lb (83.9 kg)   BMI 29.86 kg/m  Physical Exam  Constitutional: She is oriented to person, place, and time. She appears well-developed and well-nourished. No distress.  Cardiovascular: Normal rate, regular rhythm, normal heart sounds and normal pulses. Exam reveals no gallop and no friction rub.  No murmur heard. Pulmonary/Chest: Effort normal and breath sounds normal. She exhibits no mass, no tenderness and no  edema. Right breast exhibits no inverted nipple, no mass, no nipple discharge, no skin change and no tenderness. Left breast exhibits no inverted nipple, no mass, no nipple discharge, no skin change and no tenderness. No breast swelling or tenderness.  Abdominal: Normal appearance.  Musculoskeletal: Normal range of motion.  Lymphadenopathy:    She has no axillary adenopathy.       Right axillary: No pectoral and no lateral adenopathy present.       Left axillary: No pectoral and no lateral adenopathy present. Neurological: She is alert and oriented to person, place, and time.  Skin: Skin is warm and dry. No abrasion, no bruising, no lesion and no rash noted. No erythema.  Psychiatric: She has a normal mood and affect. Her speech is normal and behavior is normal. Judgment normal.  Vitals reviewed.  ASSESSMENT/PLAN: normal breast exam No further pain from [facility] Cont Vit E and caffeine restriction  Vit D 1000U daily after finishes Rx therapy advised  A total of 15 minutes were spent face-to-face with the patient during this encounter and over half of that time dealt with counseling and coordination of care.  Barnett Applebaum, MD, [facility], [facility] 09/10/2018  2:59 PM

## 2018-09-19 ENCOUNTER — Other Ambulatory Visit: Payer: Self-pay | Admitting: Obstetrics & Gynecology

## 2018-09-19 DIAGNOSIS — I10 Essential (primary) hypertension: Secondary | ICD-10-CM

## 2018-09-22 NOTE — Telephone Encounter (Signed)
Please advise 

## 2018-09-23 ENCOUNTER — Other Ambulatory Visit: Payer: Self-pay | Admitting: Obstetrics & Gynecology

## 2018-09-23 NOTE — Telephone Encounter (Signed)
Rec see Dr Ronnald Ramp (PCP) for this

## 2018-10-14 ENCOUNTER — Telehealth: Payer: Self-pay

## 2018-10-14 NOTE — Telephone Encounter (Signed)
Pt called after hours triage line stating she needs a refill of her Hydrochlorothiazide sent to [place].   [place] and Sharlett Iles are our of the office today. SDJ is only MD. Please see if he will refill this. Thanks

## 2018-10-16 ENCOUNTER — Other Ambulatory Visit: Payer: Self-pay | Admitting: Obstetrics and Gynecology

## 2018-10-16 DIAGNOSIS — I10 Essential (primary) hypertension: Secondary | ICD-10-CM

## 2018-10-16 MED ORDER — HYDROCHLOROTHIAZIDE 12.5 MG PO CAPS: 12.5000 mg | ORAL_CAPSULE | Freq: Every day | ORAL | 0 refills | Status: DC

## 2018-10-16 NOTE — Telephone Encounter (Signed)
Rx called for 30-day supply. Not sure if regularly prescribes given that he only gave her a 90-day supply at her annual.

## 2018-10-16 NOTE — Telephone Encounter (Signed)
Pt aware.

## 2018-10-17 ENCOUNTER — Other Ambulatory Visit: Payer: Self-pay

## 2018-10-17 ENCOUNTER — Other Ambulatory Visit: Payer: Self-pay | Admitting: Obstetrics & Gynecology

## 2018-10-17 ENCOUNTER — Telehealth: Payer: Self-pay

## 2018-10-17 DIAGNOSIS — I10 Essential (primary) hypertension: Secondary | ICD-10-CM

## 2018-10-17 MED ORDER — HYDROCHLOROTHIAZIDE 12.5 MG PO CAPS
12.5000 mg | ORAL_CAPSULE | Freq: Every day | ORAL | 0 refills | Status: DC
Start: 2018-10-17 — End: 2018-10-30

## 2018-10-17 NOTE — Telephone Encounter (Signed)
Pt states [facility] has not record of BP med being sent.  [phone]  The transmission failed 10/16/18.  I resent it today successfully.  Pt aware.

## 2018-10-20 ENCOUNTER — Ambulatory Visit
Admission: RE | Admit: 2018-10-20 | Discharge: 2018-10-20 | Disposition: A | Discharge status: Home / Self Care | Payer: BLUE CROSS/BLUE SHIELD | Source: Ambulatory Visit | Attending: Obstetrics & Gynecology | Admitting: Obstetrics & Gynecology

## 2018-10-20 ENCOUNTER — Other Ambulatory Visit: Payer: Self-pay | Admitting: Obstetrics & Gynecology

## 2018-10-20 DIAGNOSIS — Z1231 Encounter for screening mammogram for malignant neoplasm of breast: Secondary | ICD-10-CM | POA: Diagnosis not present

## 2018-10-20 DIAGNOSIS — Z1239 Encounter for other screening for malignant neoplasm of breast: Secondary | ICD-10-CM | POA: Insufficient documentation

## 2018-10-20 DIAGNOSIS — I10 Essential (primary) hypertension: Secondary | ICD-10-CM

## 2018-10-20 MED ORDER — HYDROCHLOROTHIAZIDE 12.5 MG PO CAPS: 12.5000 mg | ORAL_CAPSULE | Freq: Every day | ORAL | 0 refills | Status: DC

## 2018-10-30 ENCOUNTER — Encounter: Payer: Self-pay | Admitting: Family Medicine

## 2018-10-30 ENCOUNTER — Ambulatory Visit (INDEPENDENT_AMBULATORY_CARE_PROVIDER_SITE_OTHER): Payer: BLUE CROSS/BLUE SHIELD | Admitting: Family Medicine

## 2018-10-30 VITALS — BP 124/82 | HR 86 | Resp 16 | Ht 66.0 in | Wt 186.0 lb

## 2018-10-30 DIAGNOSIS — B001 Herpesviral vesicular dermatitis: Secondary | ICD-10-CM

## 2018-10-30 DIAGNOSIS — I10 Essential (primary) hypertension: Secondary | ICD-10-CM

## 2018-10-30 DIAGNOSIS — Z7689 Persons encountering health services in other specified circumstances: Secondary | ICD-10-CM | POA: Diagnosis not present

## 2018-10-30 DIAGNOSIS — E663 Overweight: Secondary | ICD-10-CM

## 2018-10-30 DIAGNOSIS — E119 Type 2 diabetes mellitus without complications: Secondary | ICD-10-CM

## 2018-10-30 MED ORDER — HYDROCHLOROTHIAZIDE 12.5 MG PO CAPS: 12.5000 mg | ORAL_CAPSULE | Freq: Every day | ORAL | 1 refills | Status: DC

## 2018-10-30 MED ORDER — GLUCOSE BLOOD VI STRP: ORAL_STRIP | 2 refills | Status: DC

## 2018-10-30 MED ORDER — METFORMIN HCL 500 MG PO TABS: 500.0000 mg | ORAL_TABLET | Freq: Two times a day (BID) | ORAL | 1 refills | Status: DC

## 2018-10-30 MED ORDER — VALACYCLOVIR HCL 1 G PO TABS: 1000.0000 mg | ORAL_TABLET | Freq: Two times a day (BID) | ORAL | 0 refills | Status: DC

## 2018-10-30 NOTE — Progress Notes (Signed)
Date:  10/30/2018   Name:  April Webb   DOB:  [birth year]   MRN:  [id number]   Chief Complaint: Establish Care  Patient is a 57 year old female who presents for a comprehensive physical exam. The patient reports the following problems: HTN/Diabetes/. Health maintenance has been reviewed everything needed. Diabetes  She presents for her follow-up diabetic visit. She has type 2 diabetes mellitus. Her disease course has been stable. Pertinent negatives for hypoglycemia include no dizziness, headaches or nervousness/anxiousness. Pertinent negatives for diabetes include no blurred vision, no chest pain, no fatigue, no polydipsia, no polyphagia, no polyuria and no weakness. There are no diabetic complications. There are no known risk factors for coronary artery disease. Current diabetic treatment includes oral agent (monotherapy). An ACE inhibitor/angiotensin II receptor blocker is not being taken.  Hypertension  This is a chronic problem. The current episode started more than 1 year ago. The problem is unchanged. Associated symptoms include peripheral edema. Pertinent negatives include no blurred vision, chest pain, headaches or shortness of breath. Risk factors for coronary artery disease include diabetes mellitus. Past treatments include diuretics.  Rash  This is a new (for herpes) problem. The current episode started in the past 7 days. The affected locations include the lips. Pertinent negatives include no congestion, cough, diarrhea, eye pain, fatigue, fever, rhinorrhea, shortness of breath, sore throat or vomiting.    Review of Systems  Constitutional: Negative.  Negative for chills, fatigue, fever and unexpected weight change.  HENT: Negative for congestion, ear discharge, ear pain, rhinorrhea, sinus pressure, sneezing and sore throat.   Eyes: Negative for blurred vision, photophobia, pain, discharge, redness and itching.  Respiratory: Negative for cough, shortness of breath, wheezing  and stridor.   Cardiovascular: Negative for chest pain.  Gastrointestinal: Negative for abdominal pain, blood in stool, constipation, diarrhea, nausea and vomiting.  Endocrine: Negative for cold intolerance, heat intolerance, polydipsia, polyphagia and polyuria.  Genitourinary: Negative for dysuria, flank pain, frequency, hematuria, menstrual problem, pelvic pain, urgency, vaginal bleeding and vaginal discharge.  Musculoskeletal: Negative for arthralgias, back pain and myalgias.  Skin: Negative for rash.  Allergic/Immunologic: Negative for environmental allergies and food allergies.  Neurological: Negative for dizziness, weakness, light-headedness, numbness and headaches.  Hematological: Negative for adenopathy. Does not bruise/bleed easily.  Psychiatric/Behavioral: Negative for dysphoric mood. The patient is not nervous/anxious.     Patient Active Problem List   Diagnosis Date Noted  . Vaginal atrophy 04/24/2017    No Known Allergies  Past Surgical History:  Procedure Laterality Date  . ABDOMINAL HYSTERECTOMY    . BREAST BIOPSY Left 09/22/2009   us/ bx- neg  . BREAST CYST EXCISION Left    neg  . CESAREAN SECTION    . LAPAROTOMY    . OOPHORECTOMY    . OVARIAN CYST REMOVAL      Social History   Tobacco Use  . Smoking status: Never Smoker  . Smokeless tobacco: Never Used  Substance Use Topics  . Alcohol use: Yes    Frequency: Never    Comment: occassionally   . Drug use: No     Medication list has been reviewed and updated.  Current Meds  Medication Sig  . glucose blood (ONETOUCH VERIO) test strip Use once daily. Use as instructed.  . hydrochlorothiazide (MICROZIDE) 12.5 MG capsule Take 1 capsule (12.5 mg total) by mouth daily.  . metFORMIN (GLUCOPHAGE) 500 MG tablet Take 1 tablet (500 mg total) by mouth 2 (two) times daily with a meal.  .  Vitamin D, Ergocalciferol, (DRISDOL) 50000 units CAPS capsule Take 1 capsule (50,000 Units total) by mouth every 7 (seven) days.  For 3 months.  . [DISCONTINUED] hydrochlorothiazide (MICROZIDE) 12.5 MG capsule Take 1 capsule (12.5 mg total) by mouth daily.  . [DISCONTINUED] meloxicam (MOBIC) 15 MG tablet Take 1 tablet (15 mg total) by mouth daily.  . [DISCONTINUED] pantoprazole (PROTONIX) 40 MG tablet Take 1 tablet (40 mg total) by mouth daily.    PHQ 2/9 Scores 10/30/2018  PHQ - 2 Score 0  PHQ- 9 Score 0    Physical Exam Vitals signs and nursing note reviewed.  Constitutional:      General: She is not in acute distress.    Appearance: She is not diaphoretic.  HENT:     Head: Normocephalic and atraumatic.     Right Ear: External ear normal.     Left Ear: External ear normal.     Nose: Nose normal.  Eyes:     General:        Right eye: No discharge.        Left eye: No discharge.     Conjunctiva/sclera: Conjunctivae normal.     Pupils: Pupils are equal, round, and reactive to light.  Neck:     Musculoskeletal: Normal range of motion and neck supple.     Thyroid: No thyromegaly.     Vascular: No JVD.  Cardiovascular:     Rate and Rhythm: Normal rate and regular rhythm.     Pulses:          Dorsalis pedis pulses are 2+ on the right side and 2+ on the left side.       Posterior tibial pulses are 2+ on the right side and 2+ on the left side.     Heart sounds: Normal heart sounds. No murmur. No friction rub. No gallop.   Pulmonary:     Effort: Pulmonary effort is normal.     Breath sounds: Normal breath sounds.  Abdominal:     General: Bowel sounds are normal.     Palpations: Abdomen is soft. There is no mass.     Tenderness: There is no abdominal tenderness. There is no guarding.  Musculoskeletal: Normal range of motion.     Right foot: Normal range of motion. No deformity or bunion.     Left foot: Normal range of motion. No deformity or bunion.  Feet:     Right foot:     Protective Sensation: 10 sites tested. 10 sites sensed.     Skin integrity: Dry skin present. No ulcer, blister, skin breakdown,  erythema, warmth or callus.     Left foot:     Protective Sensation: 10 sites tested. 10 sites sensed.     Skin integrity: Dry skin present. No ulcer, blister, skin breakdown, erythema, warmth or callus.  Lymphadenopathy:     Cervical: No cervical adenopathy.  Skin:    General: Skin is warm and dry.  Neurological:     Mental Status: She is alert.     Deep Tendon Reflexes: Reflexes are normal and symmetric.     BP 124/82   Pulse 86   Resp 16   Ht 5\' 6"  (1.676 m)   Wt 186 lb (84.4 kg)   SpO2 96%   BMI 30.02 kg/m   Assessment and Plan: 1. Establishing care with new doctor, encounter for Patient today for reestablishing care with patient that she is seen in the past.  2. Benign essential hypertension .  Controlled.  Continue hydrochlorothiazide 12.5 mg daily we will check renal function panel - hydrochlorothiazide (MICROZIDE) 12.5 MG capsule; Take 1 capsule (12.5 mg total) by mouth daily.  Dispense: 90 capsule; Refill: 1 - Renal Function Panel  3. Type 2 diabetes mellitus without complication, without long-term current use of insulin (HCC) Chronic.  Controlled.  Will continue metformin 500 mg twice daily refill testers to be checking fasting blood sugars on a daily basis.  Will recheck an A1c renal function panel lipid panel and microalbuminuria. - metFORMIN (GLUCOPHAGE) 500 MG tablet; Take 1 tablet (500 mg total) by mouth 2 (two) times daily with a meal.  Dispense: 180 tablet; Refill: 1 - glucose blood (ONETOUCH VERIO) test strip; Use once daily. Use as instructed.  Dispense: 100 each; Refill: 2 - Hemoglobin A1c - Renal Function Panel - Lipid panel - Microalbumin / creatinine urine ratio  4. Overweight (BMI 25.0-29.9) Weight discussed with patient.Health risks of being over weight were discussed and patient was counseled on weight loss options and exercise.  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  No evidence of secondary infection patient was prescribed valacyclovir 1  g twice daily take 2 days - valACYclovir (VALTREX) 1000 MG tablet; Take 1 tablet (1,000 mg total) by mouth 2 (two) times daily.  Dispense: 10 tablet; Refill: 0

## 2018-10-31 LAB — RENAL FUNCTION PANEL
ALBUMIN: 4.4 g/dL (ref 3.5–5.5)
BUN/Creatinine Ratio: 18 (ref 9–23)
BUN: 13 mg/dL (ref 6–24)
CALCIUM: 9.8 mg/dL (ref 8.7–10.2)
CO2: 27 mmol/L (ref 20–29)
CREATININE: 0.74 mg/dL (ref 0.57–1.00)
Chloride: 100 mmol/L (ref 96–106)
GFR calc Af Amer: 105 mL/min/{1.73_m2} (ref >59)
GFR calc non Af Amer: 91 mL/min/{1.73_m2} (ref >59)
Glucose: 243 mg/dL — ABNORMAL HIGH (ref 65–99)
PHOSPHORUS: 3 mg/dL (ref 2.5–4.5)
POTASSIUM: 4.5 mmol/L (ref 3.5–5.2)
Sodium: 137 mmol/L (ref 134–144)

## 2018-10-31 LAB — LIPID PANEL
CHOLESTEROL TOTAL: 187 mg/dL (ref 100–199)
Chol/HDL Ratio: 2.8 ratio (ref 0.0–4.4)
HDL: 66 mg/dL (ref >39)
LDL CALC: 105 mg/dL — AB (ref 0–99)
TRIGLYCERIDES: 80 mg/dL (ref 0–149)
VLDL Cholesterol Cal: 16 mg/dL (ref 5–40)

## 2018-10-31 LAB — MICROALBUMIN / CREATININE URINE RATIO
Creatinine, Urine: 131 mg/dL
MICROALB/CREAT RATIO: 5 mg/g{creat} (ref 0.0–30.0)
Microalbumin, Urine: 6.6 ug/mL

## 2018-10-31 LAB — HEMOGLOBIN A1C
ESTIMATED AVERAGE GLUCOSE: 280 mg/dL
Hgb A1c MFr Bld: 11.4 % — ABNORMAL HIGH (ref 4.8–5.6)

## 2018-11-03 ENCOUNTER — Other Ambulatory Visit: Payer: Self-pay

## 2018-11-03 DIAGNOSIS — E119 Type 2 diabetes mellitus without complications: Secondary | ICD-10-CM

## 2018-11-03 MED ORDER — GLIPIZIDE 5 MG PO TABS: 5.0000 mg | ORAL_TABLET | Freq: Two times a day (BID) | ORAL | 1 refills | Status: DC

## 2018-11-03 NOTE — Telephone Encounter (Signed)
error 

## 2018-12-01 ENCOUNTER — Ambulatory Visit
Admission: EM | Admit: 2018-12-01 | Discharge: 2018-12-01 | Disposition: A | Discharge status: Home / Self Care | Payer: BLUE CROSS/BLUE SHIELD | Attending: Family Medicine | Admitting: Family Medicine

## 2018-12-01 ENCOUNTER — Other Ambulatory Visit: Payer: Self-pay

## 2018-12-01 ENCOUNTER — Encounter: Payer: Self-pay | Admitting: Emergency Medicine

## 2018-12-01 DIAGNOSIS — E119 Type 2 diabetes mellitus without complications: Secondary | ICD-10-CM

## 2018-12-01 DIAGNOSIS — W230XXA Caught, crushed, jammed, or pinched between moving objects, initial encounter: Secondary | ICD-10-CM | POA: Diagnosis not present

## 2018-12-01 DIAGNOSIS — S61309A Unspecified open wound of unspecified finger with damage to nail, initial encounter: Secondary | ICD-10-CM | POA: Insufficient documentation

## 2018-12-01 MED ORDER — AMOXICILLIN-POT CLAVULANATE 875-125 MG PO TABS: 1.0000 | ORAL_TABLET | Freq: Two times a day (BID) | ORAL | 0 refills | Status: DC

## 2018-12-01 NOTE — ED Triage Notes (Signed)
Patient c/o getting her left index fingernail stuck in the car door and her nail was ripped.

## 2018-12-01 NOTE — ED Provider Notes (Signed)
[facility]    CSN: [id number] Arrival date & time: 12/01/18  0940     History   Chief Complaint Chief Complaint  Patient presents with  . Nail Problem    HPI April Webb is a 58 y.o. female.   58 yo female with a c/o her left index fingernail "ripped" off when she was struck with a car door. Patient has artificial nails and the nail came off with part of her natural nail at the tip of her fingernail. Patient is up to date on her tetanus vaccine.   The history is provided by the patient.    Past Medical History:  Diagnosis Date  . Diabetes mellitus without complication ([place])   . GERD (gastroesophageal reflux disease)   . Hypertension     Patient Active Problem List   Diagnosis Date Noted  . Vaginal atrophy 04/24/2017    Past Surgical History:  Procedure Laterality Date  . ABDOMINAL HYSTERECTOMY    . BREAST BIOPSY Left 09/22/2009   us/ bx- neg  . BREAST CYST EXCISION Left    neg  . CESAREAN SECTION    . LAPAROTOMY    . OOPHORECTOMY    . OVARIAN CYST REMOVAL      OB History    Gravida  3   Para  3   Term  3   Preterm      AB      Living  3     SAB      TAB      Ectopic      Multiple      Live Births               Home Medications    Prior to Admission medications   Medication Sig Start Date End Date Taking? Authorizing Provider  glipiZIDE (GLUCOTROL) 5 MG tablet Take 1 tablet (5 mg total) by mouth 2 (two) times daily before a meal. 11/03/18  Yes Otilio Miu C, MD  glucose blood (ONETOUCH VERIO) test strip Use once daily. Use as instructed. 10/30/18  Yes Juline Patch, MD  hydrochlorothiazide (MICROZIDE) 12.5 MG capsule Take 1 capsule (12.5 mg total) by mouth daily. 10/30/18  Yes Juline Patch, MD  metFORMIN (GLUCOPHAGE) 500 MG tablet Take 1 tablet (500 mg total) by mouth 2 (two) times daily with a meal. 10/30/18  Yes Juline Patch, MD  valACYclovir (VALTREX) 1000 MG tablet Take 1 tablet (1,000 mg total) by  mouth 2 (two) times daily. 10/30/18  Yes Juline Patch, MD  Vitamin D, Ergocalciferol, (DRISDOL) 50000 units CAPS capsule Take 1 capsule (50,000 Units total) by mouth every 7 (seven) days. For 3 months. 07/14/18  Yes Gae Dry, MD  amoxicillin-clavulanate (AUGMENTIN) 875-125 MG tablet Take 1 tablet by mouth 2 (two) times daily. 12/01/18   Norval Gable, MD    Family History Family History  Problem Relation Age of Onset  . Colon cancer Mother   . Hypertension Father   . Prostate cancer Father   . Breast cancer Neg Hx     Social History Social History   Tobacco Use  . Smoking status: Never Smoker  . Smokeless tobacco: Never Used  Substance Use Topics  . Alcohol use: Yes    Frequency: Never    Comment: occassionally   . Drug use: No     Allergies   Patient has no known allergies.   Review of Systems Review of Systems   Physical Exam Triage  Vital Signs ED Triage Vitals  Enc Vitals Group     BP 12/01/18 1019 (!) 154/80     Pulse Rate 12/01/18 1019 74     Resp --      Temp 12/01/18 1019 98.1 F (36.7 C)     Temp Source 12/01/18 1019 Oral     SpO2 12/01/18 1019 98 %     Weight 12/01/18 1020 184 lb (83.5 kg)     Height 12/01/18 1020 5\' 6"  (1.676 m)     Head Circumference --      Peak Flow --      Pain Score 12/01/18 1020 8     Pain Loc --      Pain Edu? --      Excl. in [place]? --    No data found.  Updated Vital Signs BP (!) 154/80 (BP Location: Right Arm)   Pulse 74   Temp 98.1 F (36.7 C) (Oral)   Ht 5\' 6"  (1.676 m)   Wt 83.5 kg   SpO2 98%   BMI 29.70 kg/m   Visual Acuity Right Eye Distance:   Left Eye Distance:   Bilateral Distance:    Right Eye Near:   Left Eye Near:    Bilateral Near:     Physical Exam Vitals signs and nursing note reviewed.  Constitutional:      General: She is not in acute distress.    Appearance: Normal appearance. She is not ill-appearing, toxic-appearing or diaphoretic.  Musculoskeletal:     Left hand: She  exhibits tenderness. She exhibits normal range of motion, no bony tenderness, normal two-point discrimination, normal capillary refill, no deformity, no laceration and no swelling. Normal sensation noted. Normal strength noted.     Comments: Left index fingernail avulsed at the tip (distal third) with medial aspect still attached; artificial nail attached to avulsed natural nail; no nail bed involvement;  no active bleeding  Neurological:     Mental Status: She is alert.      UC Treatments / Results  Labs (all labs ordered are listed, but only abnormal results are displayed) Labs Reviewed - No data to display  EKG None  Radiology No results found.  Procedures Procedures (including critical care time)  Medications Ordered in UC Medications - No data to display  Initial Impression / Assessment and Plan / UC Course  I have reviewed the triage vital signs and the nursing notes.  Pertinent labs & imaging results that were available during my care of the patient were reviewed by me and considered in my medical decision making (see chart for details).      Final Clinical Impressions(s) / UC Diagnoses   Final diagnoses:  Avulsion of fingernail, initial encounter    ED Prescriptions    Medication Sig Dispense Auth. Provider   amoxicillin-clavulanate (AUGMENTIN) 875-125 MG tablet Take 1 tablet by mouth 2 (two) times daily. 14 tablet Tiann Saha, Linward Foster, MD      1. diagnosis reviewed with patient 2. rx as per orders above; reviewed possible side effects, interactions, risks and benefits  3. Recommend supportive treatment with routine wound care 4. Follow-up prn if symptoms worsen or don't improve  Controlled Substance Prescriptions [place] Controlled Substance Registry consulted? Not Applicable   Norval Gable, MD 12/01/18 1409

## 2018-12-11 ENCOUNTER — Encounter: Payer: Self-pay | Admitting: Family Medicine

## 2018-12-11 ENCOUNTER — Encounter: Payer: Self-pay | Admitting: *Deleted

## 2018-12-11 ENCOUNTER — Ambulatory Visit (INDEPENDENT_AMBULATORY_CARE_PROVIDER_SITE_OTHER): Payer: BLUE CROSS/BLUE SHIELD | Admitting: Family Medicine

## 2018-12-11 VITALS — BP 112/62 | HR 76 | Ht 66.0 in | Wt 182.0 lb

## 2018-12-11 DIAGNOSIS — R1310 Dysphagia, unspecified: Secondary | ICD-10-CM

## 2018-12-11 DIAGNOSIS — E119 Type 2 diabetes mellitus without complications: Secondary | ICD-10-CM | POA: Diagnosis not present

## 2018-12-11 DIAGNOSIS — Z1211 Encounter for screening for malignant neoplasm of colon: Secondary | ICD-10-CM | POA: Diagnosis not present

## 2018-12-11 DIAGNOSIS — R1319 Other dysphagia: Secondary | ICD-10-CM

## 2018-12-11 NOTE — Patient Instructions (Addendum)
Carbohydrate Counting for Diabetes Mellitus, Adult  Carbohydrate counting is a method of keeping track of how many carbohydrates you eat. Eating carbohydrates naturally increases the amount of sugar (glucose) in the blood. Counting how many carbohydrates you eat helps keep your blood glucose within normal limits, which helps you manage your diabetes (diabetes mellitus). It is important to know how many carbohydrates you can safely have in each meal. This is different for every person. A diet and nutrition specialist (registered dietitian) can help you make a meal plan and calculate how many carbohydrates you should have at each meal and snack. Carbohydrates are found in the following foods:  Grains, such as breads and cereals.  Dried beans and soy products.  Starchy vegetables, such as potatoes, peas, and corn.  Fruit and fruit juices.  Milk and yogurt.  Sweets and snack foods, such as cake, cookies, candy, chips, and soft drinks. How do I count carbohydrates? There are two ways to count carbohydrates in food. You can use either of the methods or a combination of both. Reading "Nutrition Facts" on packaged food The "Nutrition Facts" list is included on the labels of almost all packaged foods and beverages in the [place] It includes:  The serving size.  Information about nutrients in each serving, including the grams (g) of carbohydrate per serving. To use the "Nutrition Facts":  Decide how many servings you will have.  Multiply the number of servings by the number of carbohydrates per serving.  The resulting number is the total amount of carbohydrates that you will be having. Learning standard serving sizes of other foods When you eat carbohydrate foods that are not packaged or do not include "Nutrition Facts" on the label, you need to measure the servings in order to count the amount of carbohydrates:  Measure the foods that you will eat with a food scale or measuring cup, if needed.   Decide how many standard-size servings you will eat.  Multiply the number of servings by 15. Most carbohydrate-rich foods have about 15 g of carbohydrates per serving. ? For example, if you eat 8 oz (170 g) of strawberries, you will have eaten 2 servings and 30 g of carbohydrates (2 servings x 15 g = 30 g).  For foods that have more than one food mixed, such as soups and casseroles, you must count the carbohydrates in each food that is included. The following list contains standard serving sizes of common carbohydrate-rich foods. Each of these servings has about 15 g of carbohydrates:   hamburger bun or  English muffin.   oz (15 mL) syrup.   oz (14 g) jelly.  1 slice of bread.  1 six-inch tortilla.  3 oz (85 g) cooked rice or pasta.  4 oz (113 g) cooked dried beans.  4 oz (113 g) starchy vegetable, such as peas, corn, or potatoes.  4 oz (113 g) hot cereal.  4 oz (113 g) mashed potatoes or  of a large baked potato.  4 oz (113 g) canned or frozen fruit.  4 oz (120 mL) fruit juice.  4-6 crackers.  6 chicken nuggets.  6 oz (170 g) unsweetened dry cereal.  6 oz (170 g) plain fat-free yogurt or yogurt sweetened with artificial sweeteners.  8 oz (240 mL) milk.  8 oz (170 g) fresh fruit or one small piece of fruit.  24 oz (680 g) popped popcorn. Example of carbohydrate counting Sample meal  3 oz (85 g) chicken breast.  6 oz (170 g)   brown rice.  4 oz (113 g) corn.  8 oz (240 mL) milk.  8 oz (170 g) strawberries with sugar-free whipped topping. Carbohydrate calculation 1. Identify the foods that contain carbohydrates: ? Rice. ? Corn. ? Milk. ? Strawberries. 2. Calculate how many servings you have of each food: ? 2 servings rice. ? 1 serving corn. ? 1 serving milk. ? 1 serving strawberries. 3. Multiply each number of servings by 15 g: ? 2 servings rice x 15 g = 30 g. ? 1 serving corn x 15 g = 15 g. ? 1 serving milk x 15 g = 15 g. ? 1 serving  strawberries x 15 g = 15 g. 4. Add together all of the amounts to find the total grams of carbohydrates eaten: ? 30 g + 15 g + 15 g + 15 g = 75 g of carbohydrates total. Summary  Carbohydrate counting is a method of keeping track of how many carbohydrates you eat.  Eating carbohydrates naturally increases the amount of sugar (glucose) in the blood.  Counting how many carbohydrates you eat helps keep your blood glucose within normal limits, which helps you manage your diabetes.  A diet and nutrition specialist (registered dietitian) can help you make a meal plan and calculate how many carbohydrates you should have at each meal and snack. This information is not intended to replace advice given to you by your health care provider. Make sure you discuss any questions you have with your health care provider. Document Released: 10/22/2005 Document Revised: 05/01/2017 Document Reviewed: 04/04/2016 Elsevier Interactive Patient Education  2019 [employer]  

## 2018-12-11 NOTE — Progress Notes (Signed)
Date:  12/11/2018   Name:  April Webb   DOB:  [birth year]   MRN:  [id number]   Chief Complaint: Diabetes (follow up- hasn't been taking metformin due to antibiotic. )  Diabetes  She presents for her follow-up diabetic visit. She has type 2 diabetes mellitus. Her disease course has been fluctuating. There are no hypoglycemic associated symptoms. Pertinent negatives for hypoglycemia include no dizziness or nervousness/anxiousness. Pertinent negatives for diabetes include no blurred vision, no chest pain, no fatigue, no foot paresthesias, no foot ulcerations, no polydipsia, no polyphagia, no polyuria, no visual change, no weakness and no weight loss. There are no hypoglycemic complications. Symptoms are worsening. There are no diabetic complications. There are no known risk factors for coronary artery disease. Current diabetic treatment includes oral agent (dual therapy). She is compliant with treatment most of the time. She is following a generally healthy diet. An ACE inhibitor/angiotensin II receptor blocker is not being taken. She does not see a podiatrist.Eye exam is not current.  GI Problem  Primary symptoms do not include fever, weight loss, fatigue, abdominal pain, nausea, vomiting, diarrhea, melena, hematemesis, jaundice, hematochezia, dysuria, myalgias, arthralgias or rash.  The illness does not include chills, anorexia, dysphagia, odynophagia, bloating, constipation, tenesmus, back pain or itching. Associated medical issues do not include inflammatory bowel disease, GERD, gallstones, liver disease, alcohol abuse, PUD, gastric bypass, bowel resection, irritable bowel syndrome, hemorrhoids or diverticulitis.    Review of Systems  Constitutional: Negative.  Negative for chills, fatigue, fever, unexpected weight change and weight loss.  HENT: Negative for congestion, ear discharge, ear pain, rhinorrhea, sinus pressure, sneezing and sore throat.   Eyes: Negative for blurred vision,  photophobia, pain, discharge, redness and itching.  Respiratory: Negative for cough, shortness of breath, wheezing and stridor.   Cardiovascular: Negative for chest pain.  Gastrointestinal: Negative for abdominal pain, anorexia, bloating, blood in stool, constipation, diarrhea, dysphagia, hematemesis, hematochezia, jaundice, melena, nausea and vomiting.  Endocrine: Negative for cold intolerance, heat intolerance, polydipsia, polyphagia and polyuria.  Genitourinary: Negative for dysuria, flank pain, menstrual problem, pelvic pain, urgency, vaginal bleeding and vaginal discharge.  Musculoskeletal: Negative for arthralgias, back pain and myalgias.  Skin: Negative for itching and rash.  Allergic/Immunologic: Negative for environmental allergies and food allergies.  Neurological: Negative for dizziness, weakness, light-headedness and numbness.  Hematological: Negative for adenopathy. Does not bruise/bleed easily.  Psychiatric/Behavioral: Negative for dysphoric mood. The patient is not nervous/anxious.     Patient Active Problem List   Diagnosis Date Noted  . Vaginal atrophy 04/24/2017    No Known Allergies  Past Surgical History:  Procedure Laterality Date  . ABDOMINAL HYSTERECTOMY    . BREAST BIOPSY Left 09/22/2009   us/ bx- neg  . BREAST CYST EXCISION Left    neg  . CESAREAN SECTION    . LAPAROTOMY    . OOPHORECTOMY    . OVARIAN CYST REMOVAL      Social History   Tobacco Use  . Smoking status: Never Smoker  . Smokeless tobacco: Never Used  Substance Use Topics  . Alcohol use: Yes    Frequency: Never    Comment: occassionally   . Drug use: No     Medication list has been reviewed and updated.  Current Meds  Medication Sig  . cholecalciferol (VITAMIN D3) 25 MCG (1000 UT) tablet Take 1,000 Units by mouth daily.  Marland Kitchen glucose blood (ONETOUCH VERIO) test strip Use once daily. Use as instructed.  . hydrochlorothiazide (MICROZIDE) 12.5 MG capsule  Take 1 capsule (12.5 mg total)  by mouth daily.  . [DISCONTINUED] amoxicillin-clavulanate (AUGMENTIN) 875-125 MG tablet Take 1 tablet by mouth 2 (two) times daily.    PHQ 2/9 Scores 10/30/2018  PHQ - 2 Score 0  PHQ- 9 Score 0    Physical Exam Vitals signs and nursing note reviewed.  Constitutional:      General: She is not in acute distress.    Appearance: She is not diaphoretic.  HENT:     Head: Normocephalic and atraumatic.     Right Ear: External ear normal.     Left Ear: External ear normal.     Nose: Nose normal.  Eyes:     General:        Right eye: No discharge.        Left eye: No discharge.     Conjunctiva/sclera: Conjunctivae normal.     Pupils: Pupils are equal, round, and reactive to light.  Neck:     Musculoskeletal: Normal range of motion and neck supple.     Thyroid: No thyromegaly.     Vascular: No JVD.  Cardiovascular:     Rate and Rhythm: Normal rate and regular rhythm.     Heart sounds: Normal heart sounds. No murmur. No friction rub. No gallop.   Pulmonary:     Effort: Pulmonary effort is normal.     Breath sounds: Normal breath sounds. No wheezing or rhonchi.  Abdominal:     General: Abdomen is flat. Bowel sounds are normal.     Palpations: Abdomen is soft. There is no mass.     Tenderness: There is no abdominal tenderness. There is no guarding.  Musculoskeletal: Normal range of motion.  Lymphadenopathy:     Cervical: No cervical adenopathy.  Skin:    General: Skin is warm and dry.  Neurological:     Mental Status: She is alert.     Deep Tendon Reflexes: Reflexes are normal and symmetric.     BP 112/62   Pulse 76   Ht 5\' 6"  (1.676 m)   Wt 182 lb (82.6 kg)   BMI 29.38 kg/m   Assessment and Plan: 1. Type 2 diabetes mellitus without complication, without long-term current use of insulin ([name]) Patient is follow-up for her diabetes.  There was some initial area that may have either been from Augmentin or viral gastroenteritis or perhaps metformin in which the patient was  reluctant to maintain dosing of metformin this was discussed with patient and patient understands that we have a pattern that were going for which includes metformin and glipizide.  Will resume this and diet was discussed.  We will recheck in 6 weeks at which time an A1c will be done  2. Colon cancer screening Patient has for colon cancer screening and will refer to gastroenterology - Ambulatory referral to Gastroenterology  3. Esophageal dysphagia In the recent past weeks patient has some issues with dysphasia in the early part of the esophagus particularly when eating meats.  Will be brought to the attention of the gastroenterologist in the event that they decide to evaluate with endoscopy. - Ambulatory referral to Gastroenterology

## 2018-12-25 ENCOUNTER — Other Ambulatory Visit (INDEPENDENT_AMBULATORY_CARE_PROVIDER_SITE_OTHER): Payer: BLUE CROSS/BLUE SHIELD

## 2018-12-25 DIAGNOSIS — Z1211 Encounter for screening for malignant neoplasm of colon: Secondary | ICD-10-CM

## 2018-12-25 LAB — HEMOCCULT GUIAC POC 1CARD (OFFICE)
Card #2 Fecal Occult Blod, POC: NEGATIVE
Card #3 Fecal Occult Blood, POC: NEGATIVE
Fecal Occult Blood, POC: NEGATIVE

## 2019-01-12 ENCOUNTER — Encounter: Payer: Self-pay | Admitting: Gastroenterology

## 2019-01-12 ENCOUNTER — Other Ambulatory Visit: Payer: Self-pay

## 2019-01-12 ENCOUNTER — Ambulatory Visit (INDEPENDENT_AMBULATORY_CARE_PROVIDER_SITE_OTHER): Payer: BLUE CROSS/BLUE SHIELD | Admitting: Gastroenterology

## 2019-01-12 ENCOUNTER — Encounter: Payer: Self-pay | Admitting: *Deleted

## 2019-01-12 VITALS — BP 150/74 | HR 80 | Ht 66.0 in | Wt 187.2 lb

## 2019-01-12 DIAGNOSIS — Z1211 Encounter for screening for malignant neoplasm of colon: Secondary | ICD-10-CM

## 2019-01-12 DIAGNOSIS — R4702 Dysphasia: Secondary | ICD-10-CM | POA: Diagnosis not present

## 2019-01-12 MED ORDER — NA SULFATE-K SULFATE-MG SULF 17.5-3.13-1.6 GM/177ML PO SOLN: 1.0000 | ORAL | 0 refills | Status: DC

## 2019-01-12 NOTE — H&P (View-Only) (Signed)
Gastroenterology Consultation  Referring Provider:     Juline Patch, MD Primary Care Physician:  Juline Patch, MD Primary Gastroenterologist:  Dr. Allen Norris     Reason for Consultation:     Dysphasia        HPI:   April Webb is a 58 y.o. y/o female referred for consultation & management of dysphasia by Dr. Juline Patch, MD.  This patient comes in today with a report of dysphasia.  The patient states that she has a family history of colon cancer and is also due for colonoscopy.  The patient reports that her last colonoscopy was in 2014.  The patient endorses the dysphasia to be more to solids than liquids.  She states that the food does not get completely obstructing her esophagus but does cause her to feel like it is sticking.  She has not had any episodes where she has had to go to the emergency room due to food being stuck in her esophagus.  There is no report of any unexplained weight loss fevers chills nausea or vomiting.  The patient also denies any black stools or bloody stools.   The patient is also been reporting that she is having some muscle cramps in her lower extremities and is not sure why she is having those.  Past Medical History:  Diagnosis Date  . Diabetes mellitus without complication ([place])   . GERD (gastroesophageal reflux disease)   . Hypertension     Past Surgical History:  Procedure Laterality Date  . ABDOMINAL HYSTERECTOMY    . BREAST BIOPSY Left 09/22/2009   us/ bx- neg  . BREAST CYST EXCISION Left    neg  . CESAREAN SECTION    . LAPAROTOMY    . OOPHORECTOMY    . OVARIAN CYST REMOVAL      Prior to Admission medications   Medication Sig Start Date End Date Taking? Authorizing Provider  cholecalciferol (VITAMIN D3) 25 MCG (1000 UT) tablet Take 1,000 Units by mouth daily.   Yes [provider]  glipiZIDE (GLUCOTROL) 5 MG tablet Take 1 tablet (5 mg total) by mouth 2 (two) times daily before a meal. 11/03/18  Yes Otilio Miu C, MD   glucose blood (ONETOUCH VERIO) test strip Use once daily. Use as instructed. 10/30/18  Yes Juline Patch, MD  hydrochlorothiazide (MICROZIDE) 12.5 MG capsule Take 1 capsule (12.5 mg total) by mouth daily. 10/30/18  Yes Juline Patch, MD  metFORMIN (GLUCOPHAGE) 500 MG tablet Take 1 tablet (500 mg total) by mouth 2 (two) times daily with a meal. 10/30/18  Yes Juline Patch, MD    Family History  Problem Relation Age of Onset  . Colon cancer Mother   . Hypertension Father   . Prostate cancer Father   . Breast cancer Neg Hx      Social History   Tobacco Use  . Smoking status: Never Smoker  . Smokeless tobacco: Never Used  Substance Use Topics  . Alcohol use: Yes    Frequency: Never    Comment: occassionally   . Drug use: No    Allergies as of 01/12/2019  . (No Known Allergies)    Review of Systems:    All systems reviewed and negative except where noted in HPI.   Physical Exam:  There were no vitals taken for this visit. No LMP recorded. Patient has had a hysterectomy. General:   Alert,  Well-developed, well-nourished, pleasant and cooperative in NAD Head:  Normocephalic and atraumatic. Eyes:  Sclera clear, no icterus.   Conjunctiva pink. Ears:  Normal auditory acuity. Nose:  No deformity, discharge, or lesions. Mouth:  No deformity or lesions,oropharynx pink & moist. Neck:  Supple; no masses or thyromegaly. Lungs:  Respirations even and unlabored.  Clear throughout to auscultation.   No wheezes, crackles, or rhonchi. No acute distress. Heart:  Regular rate and rhythm; no murmurs, clicks, rubs, or gallops. Abdomen:  Normal bowel sounds.  No bruits.  Soft, non-tender and non-distended without masses, hepatosplenomegaly or hernias noted.  No guarding or rebound tenderness.  Negative Carnett sign.   Rectal:  Deferred.  Msk:  Symmetrical without gross deformities.  Good, equal movement & strength bilaterally. Pulses:  Normal pulses noted. Extremities:  No clubbing or  edema.  No cyanosis. Neurologic:  Alert and oriented x3;  grossly normal neurologically. Skin:  Intact without significant lesions or rashes.  No jaundice. Lymph Nodes:  No significant cervical adenopathy. Psych:  Alert and cooperative. Normal mood and affect.  Imaging Studies: No results found.  Assessment and Plan:   April Webb is a 58 y.o. y/o female who comes in with dysphasia.  The patient states that it is worse with solids than liquids.  The patient is also due for a colonoscopy due to a family history of colon cancer.  The patient will be set up for an EGD and colonoscopy. I have discussed risks & benefits which include, but are not limited to, bleeding, infection, perforation & drug reaction.  The patient agrees with this plan & written consent will be obtained.     Lucilla Lame, MD. [facility] Regal    Note: This dictation was prepared with Dragon dictation along with smaller phrase technology. Any transcriptional errors that result from this process are unintentional.

## 2019-01-12 NOTE — Progress Notes (Signed)
Gastroenterology Consultation  Referring Provider:     Juline Patch, MD Primary Care Physician:  Juline Patch, MD Primary Gastroenterologist:  Dr. Allen Norris     Reason for Consultation:     Dysphasia        HPI:   April Webb is a 58 y.o. y/o female referred for consultation & management of dysphasia by Dr. Juline Patch, MD.  This patient comes in today with a report of dysphasia.  The patient states that she has a family history of colon cancer and is also due for colonoscopy.  The patient reports that her last colonoscopy was in 2014.  The patient endorses the dysphasia to be more to solids than liquids.  She states that the food does not get completely obstructing her esophagus but does cause her to feel like it is sticking.  She has not had any episodes where she has had to go to the emergency room due to food being stuck in her esophagus.  There is no report of any unexplained weight loss fevers chills nausea or vomiting.  The patient also denies any black stools or bloody stools.   The patient is also been reporting that she is having some muscle cramps in her lower extremities and is not sure why she is having those.  Past Medical History:  Diagnosis Date  . Diabetes mellitus without complication ([place])   . GERD (gastroesophageal reflux disease)   . Hypertension     Past Surgical History:  Procedure Laterality Date  . ABDOMINAL HYSTERECTOMY    . BREAST BIOPSY Left 09/22/2009   us/ bx- neg  . BREAST CYST EXCISION Left    neg  . CESAREAN SECTION    . LAPAROTOMY    . OOPHORECTOMY    . OVARIAN CYST REMOVAL      Prior to Admission medications   Medication Sig Start Date End Date Taking? Authorizing Provider  cholecalciferol (VITAMIN D3) 25 MCG (1000 UT) tablet Take 1,000 Units by mouth daily.   Yes [provider]  glipiZIDE (GLUCOTROL) 5 MG tablet Take 1 tablet (5 mg total) by mouth 2 (two) times daily before a meal. 11/03/18  Yes Otilio Miu C, MD    glucose blood (ONETOUCH VERIO) test strip Use once daily. Use as instructed. 10/30/18  Yes Juline Patch, MD  hydrochlorothiazide (MICROZIDE) 12.5 MG capsule Take 1 capsule (12.5 mg total) by mouth daily. 10/30/18  Yes Juline Patch, MD  metFORMIN (GLUCOPHAGE) 500 MG tablet Take 1 tablet (500 mg total) by mouth 2 (two) times daily with a meal. 10/30/18  Yes Juline Patch, MD    Family History  Problem Relation Age of Onset  . Colon cancer Mother   . Hypertension Father   . Prostate cancer Father   . Breast cancer Neg Hx      Social History   Tobacco Use  . Smoking status: Never Smoker  . Smokeless tobacco: Never Used  Substance Use Topics  . Alcohol use: Yes    Frequency: Never    Comment: occassionally   . Drug use: No    Allergies as of 01/12/2019  . (No Known Allergies)    Review of Systems:    All systems reviewed and negative except where noted in HPI.   Physical Exam:  There were no vitals taken for this visit. No LMP recorded. Patient has had a hysterectomy. General:   Alert,  Well-developed, well-nourished, pleasant and cooperative in NAD Head:  Normocephalic and atraumatic. Eyes:  Sclera clear, no icterus.   Conjunctiva pink. Ears:  Normal auditory acuity. Nose:  No deformity, discharge, or lesions. Mouth:  No deformity or lesions,oropharynx pink & moist. Neck:  Supple; no masses or thyromegaly. Lungs:  Respirations even and unlabored.  Clear throughout to auscultation.   No wheezes, crackles, or rhonchi. No acute distress. Heart:  Regular rate and rhythm; no murmurs, clicks, rubs, or gallops. Abdomen:  Normal bowel sounds.  No bruits.  Soft, non-tender and non-distended without masses, hepatosplenomegaly or hernias noted.  No guarding or rebound tenderness.  Negative Carnett sign.   Rectal:  Deferred.  Msk:  Symmetrical without gross deformities.  Good, equal movement & strength bilaterally. Pulses:  Normal pulses noted. Extremities:  No clubbing or  edema.  No cyanosis. Neurologic:  Alert and oriented x3;  grossly normal neurologically. Skin:  Intact without significant lesions or rashes.  No jaundice. Lymph Nodes:  No significant cervical adenopathy. Psych:  Alert and cooperative. Normal mood and affect.  Imaging Studies: No results found.  Assessment and Plan:   April Webb is a 58 y.o. y/o female who comes in with dysphasia.  The patient states that it is worse with solids than liquids.  The patient is also due for a colonoscopy due to a family history of colon cancer.  The patient will be set up for an EGD and colonoscopy. I have discussed risks & benefits which include, but are not limited to, bleeding, infection, perforation & drug reaction.  The patient agrees with this plan & written consent will be obtained.     Lucilla Lame, MD. [facility] Regal    Note: This dictation was prepared with Dragon dictation along with smaller phrase technology. Any transcriptional errors that result from this process are unintentional.

## 2019-01-16 NOTE — Discharge Instructions (Signed)
General Anesthesia, Adult, Care After  This sheet gives you information about how to care for yourself after your procedure. Your health care provider may also give you more specific instructions. If you have problems or questions, contact your health care provider.  What can I expect after the procedure?  After the procedure, the following side effects are common:  Pain or discomfort at the IV site.  Nausea.  Vomiting.  Sore throat.  Trouble concentrating.  Feeling cold or chills.  Weak or tired.  Sleepiness and fatigue.  Soreness and body aches. These side effects can affect parts of the body that were not involved in surgery.  Follow these instructions at home:    For at least 24 hours after the procedure:  Have a responsible adult stay with you. It is important to have someone help care for you until you are awake and alert.  Rest as needed.  Do not:  Participate in activities in which you could fall or become injured.  Drive.  Use heavy machinery.  Drink alcohol.  Take sleeping pills or medicines that cause drowsiness.  Make important decisions or sign legal documents.  Take care of children on your own.  Eating and drinking  Follow any instructions from your health care provider about eating or drinking restrictions.  When you feel hungry, start by eating small amounts of foods that are soft and easy to digest (bland), such as toast. Gradually return to your regular diet.  Drink enough fluid to keep your urine pale yellow.  If you vomit, rehydrate by drinking water, juice, or clear broth.  General instructions  If you have sleep apnea, surgery and certain medicines can increase your risk for breathing problems. Follow instructions from your health care provider about wearing your sleep device:  Anytime you are sleeping, including during daytime naps.  While taking prescription pain medicines, sleeping medicines, or medicines that make you drowsy.  Return to your normal activities as told by your health care  provider. Ask your health care provider what activities are safe for you.  Take over-the-counter and prescription medicines only as told by your health care provider.  If you smoke, do not smoke without supervision.  Keep all follow-up visits as told by your health care provider. This is important.  Contact a health care provider if:  You have nausea or vomiting that does not get better with medicine.  You cannot eat or drink without vomiting.  You have pain that does not get better with medicine.  You are unable to pass urine.  You develop a skin rash.  You have a fever.  You have redness around your IV site that gets worse.  Get help right away if:  You have difficulty breathing.  You have chest pain.  You have blood in your urine or stool, or you vomit blood.  Summary  After the procedure, it is common to have a sore throat or nausea. It is also common to feel tired.  Have a responsible adult stay with you for the first 24 hours after general anesthesia. It is important to have someone help care for you until you are awake and alert.  When you feel hungry, start by eating small amounts of foods that are soft and easy to digest (bland), such as toast. Gradually return to your regular diet.  Drink enough fluid to keep your urine pale yellow.  Return to your normal activities as told by your health care provider. Ask your health care   provider what activities are safe for you.  This information is not intended to replace advice given to you by your health care provider. Make sure you discuss any questions you have with your health care provider.  Document Released: 01/28/2001 Document Revised: 06/07/2017 Document Reviewed: 06/07/2017  Elsevier Interactive Patient Education  2019 [employer]

## 2019-01-19 ENCOUNTER — Ambulatory Visit: Payer: BLUE CROSS/BLUE SHIELD | Admitting: Anesthesiology

## 2019-01-19 ENCOUNTER — Encounter
Admission: RE | Disposition: A | Discharge status: Home / Self Care | Payer: Self-pay | Source: Home / Self Care | Attending: Gastroenterology

## 2019-01-19 ENCOUNTER — Ambulatory Visit
Admission: RE | Admit: 2019-01-19 | Discharge: 2019-01-19 | Disposition: A | Discharge status: Home / Self Care | Payer: BLUE CROSS/BLUE SHIELD | Attending: Gastroenterology | Admitting: Gastroenterology

## 2019-01-19 ENCOUNTER — Other Ambulatory Visit: Payer: Self-pay

## 2019-01-19 DIAGNOSIS — Z79899 Other long term (current) drug therapy: Secondary | ICD-10-CM | POA: Insufficient documentation

## 2019-01-19 DIAGNOSIS — Z8042 Family history of malignant neoplasm of prostate: Secondary | ICD-10-CM | POA: Insufficient documentation

## 2019-01-19 DIAGNOSIS — K317 Polyp of stomach and duodenum: Secondary | ICD-10-CM

## 2019-01-19 DIAGNOSIS — I10 Essential (primary) hypertension: Secondary | ICD-10-CM | POA: Insufficient documentation

## 2019-01-19 DIAGNOSIS — Z8249 Family history of ischemic heart disease and other diseases of the circulatory system: Secondary | ICD-10-CM | POA: Insufficient documentation

## 2019-01-19 DIAGNOSIS — Z90710 Acquired absence of both cervix and uterus: Secondary | ICD-10-CM | POA: Insufficient documentation

## 2019-01-19 DIAGNOSIS — D131 Benign neoplasm of stomach: Secondary | ICD-10-CM | POA: Diagnosis not present

## 2019-01-19 DIAGNOSIS — D124 Benign neoplasm of descending colon: Secondary | ICD-10-CM | POA: Diagnosis not present

## 2019-01-19 DIAGNOSIS — K222 Esophageal obstruction: Secondary | ICD-10-CM | POA: Diagnosis not present

## 2019-01-19 DIAGNOSIS — K640 First degree hemorrhoids: Secondary | ICD-10-CM | POA: Insufficient documentation

## 2019-01-19 DIAGNOSIS — R1310 Dysphagia, unspecified: Secondary | ICD-10-CM | POA: Diagnosis not present

## 2019-01-19 DIAGNOSIS — D122 Benign neoplasm of ascending colon: Secondary | ICD-10-CM | POA: Diagnosis not present

## 2019-01-19 DIAGNOSIS — Z800 Family history of malignant neoplasm of digestive organs: Secondary | ICD-10-CM

## 2019-01-19 DIAGNOSIS — K219 Gastro-esophageal reflux disease without esophagitis: Secondary | ICD-10-CM | POA: Insufficient documentation

## 2019-01-19 DIAGNOSIS — Z1211 Encounter for screening for malignant neoplasm of colon: Secondary | ICD-10-CM | POA: Diagnosis not present

## 2019-01-19 DIAGNOSIS — K621 Rectal polyp: Secondary | ICD-10-CM

## 2019-01-19 DIAGNOSIS — K635 Polyp of colon: Secondary | ICD-10-CM | POA: Diagnosis not present

## 2019-01-19 DIAGNOSIS — Z7984 Long term (current) use of oral hypoglycemic drugs: Secondary | ICD-10-CM | POA: Insufficient documentation

## 2019-01-19 DIAGNOSIS — E119 Type 2 diabetes mellitus without complications: Secondary | ICD-10-CM | POA: Insufficient documentation

## 2019-01-19 HISTORY — PX: POLYPECTOMY: SHX5525

## 2019-01-19 HISTORY — PX: COLONOSCOPY WITH PROPOFOL: SHX5780

## 2019-01-19 HISTORY — DX: Nontoxic goiter, unspecified: E04.9

## 2019-01-19 HISTORY — PX: ESOPHAGEAL DILATION: SHX303

## 2019-01-19 HISTORY — PX: ESOPHAGOGASTRODUODENOSCOPY (EGD) WITH PROPOFOL: SHX5813

## 2019-01-19 HISTORY — DX: Cardiac murmur, unspecified: R01.1

## 2019-01-19 SURGERY — COLONOSCOPY WITH PROPOFOL
Anesthesia: General | Site: Throat

## 2019-01-19 MED ORDER — PROPOFOL 10 MG/ML IV BOLUS
INTRAVENOUS | Status: DC | PRN
  Administered 2019-01-19 (×10): 20 mg via INTRAVENOUS
  Administered 2019-01-19: 100 mg via INTRAVENOUS
  Administered 2019-01-19 (×4): 20 mg via INTRAVENOUS

## 2019-01-19 MED ORDER — DEXMEDETOMIDINE HCL 200 MCG/2ML IV SOLN
INTRAVENOUS | Status: DC | PRN
  Administered 2019-01-19: 8 ug via INTRAVENOUS

## 2019-01-19 MED ORDER — SODIUM CHLORIDE 0.9 % IV SOLN: INTRAVENOUS | Status: DC

## 2019-01-19 MED ORDER — STERILE WATER FOR IRRIGATION IR SOLN
Status: DC | PRN
  Administered 2019-01-19: 09:00:00

## 2019-01-19 MED ORDER — LIDOCAINE HCL (CARDIAC) PF 100 MG/5ML IV SOSY
PREFILLED_SYRINGE | INTRAVENOUS | Status: DC | PRN
  Administered 2019-01-19: 40 mg via INTRAVENOUS

## 2019-01-19 MED ORDER — LACTATED RINGERS IV SOLN
INTRAVENOUS | Status: DC
  Administered 2019-01-19: 08:00:00 via INTRAVENOUS

## 2019-01-19 MED ORDER — GLYCOPYRROLATE 0.2 MG/ML IJ SOLN
INTRAMUSCULAR | Status: DC | PRN
  Administered 2019-01-19: 0.2 mg via INTRAVENOUS

## 2019-01-19 SURGICAL SUPPLY — 12 items
BALLN DILATOR 15-18 8 (BALLOONS) ×5
BALLOON DILATOR 15-18 8 (BALLOONS) IMPLANT
BLOCK BITE 60FR ADLT L/F GRN (MISCELLANEOUS) ×5 IMPLANT
CANISTER SUCT 1200ML W/VALVE (MISCELLANEOUS) ×5 IMPLANT
FORCEPS BIOP RAD 4 LRG CAP 4 (CUTTING FORCEPS) ×2 IMPLANT
GOWN CVR UNV OPN BCK APRN NK (MISCELLANEOUS) ×6 IMPLANT
GOWN ISOL THUMB LOOP REG UNIV (MISCELLANEOUS) ×10
KIT ENDO PROCEDURE OLY (KITS) ×5 IMPLANT
SNARE SHORT THROW 13M SML OVAL (MISCELLANEOUS) ×2 IMPLANT
SYR INFLATION 60ML (SYRINGE) ×2 IMPLANT
TRAP ETRAP POLY (MISCELLANEOUS) ×2 IMPLANT
WATER STERILE IRR 250ML POUR (IV SOLUTION) ×5 IMPLANT

## 2019-01-19 NOTE — Interval H&P Note (Signed)
History and Physical Interval Note:  01/19/2019 8:37 AM  April Webb  has presented today for surgery, with the diagnosis of Screening Z12.11 Dysphagia R13.10.  The various methods of treatment have been discussed with the patient and family. After consideration of risks, benefits and other options for treatment, the patient has consented to  Procedure(s) with comments: COLONOSCOPY WITH PROPOFOL (N/A) ESOPHAGOGASTRODUODENOSCOPY (EGD) WITH PROPOFOL (N/A) - Diabetic - oral meds as a surgical intervention.  The patient's history has been reviewed, patient examined, no change in status, stable for surgery.  I have reviewed the patient's chart and labs.  Questions were answered to the patient's satisfaction.     April Webb [employer]

## 2019-01-19 NOTE — Transfer of Care (Signed)
Immediate Anesthesia Transfer of Care Note  Patient: April Webb  Procedure(s) Performed: COLONOSCOPY WITH BIOPSIES (N/A Rectum) ESOPHAGOGASTRODUODENOSCOPY (EGD) WITH BIOPSIES and DILATION (N/A Throat) ESOPHAGEAL DILATION (N/A Esophagus) POLYPECTOMY (N/A Rectum)  Patient Location: PACU  Anesthesia Type: General  Level of Consciousness: awake, alert  and patient cooperative  Airway and Oxygen Therapy: Patient Spontanous Breathing and Patient connected to supplemental oxygen  Post-op Assessment: Post-op Vital signs reviewed, Patient's Cardiovascular Status Stable, Respiratory Function Stable, Patent Airway and No signs of Nausea or vomiting  Post-op Vital Signs: Reviewed and stable  Complications: No apparent anesthesia complications

## 2019-01-19 NOTE — Op Note (Signed)
[facility] Gastroenterology Patient Name: April Webb Procedure Date: 01/19/2019 8:41 AM MRN: [id number] Account #: [id number] Date of Birth: 1961/10/24 Admit Type: Outpatient Age: 58 Room: [facility] OR ROOM 01 Gender: Female Note Status: Finalized Procedure:            Upper GI endoscopy Indications:          Dysphagia Providers:            Lucilla Lame MD, MD Referring MD:         Juline Patch, MD (Referring MD) Medicines:            Propofol per Anesthesia Complications:        No immediate complications. Procedure:            Pre-Anesthesia Assessment:                       - Prior to the procedure, a History and Physical was                        performed, and patient medications and allergies were                        reviewed. The patient's tolerance of previous                        anesthesia was also reviewed. The risks and benefits of                        the procedure and the sedation options and risks were                        discussed with the patient. All questions were                        answered, and informed consent was obtained. Prior                        Anticoagulants: The patient has taken no previous                        anticoagulant or antiplatelet agents. ASA Grade                        Assessment: II - A patient with mild systemic disease.                        After reviewing the risks and benefits, the patient was                        deemed in satisfactory condition to undergo the                        procedure.                       After obtaining informed consent, the endoscope was                        passed under direct vision. Throughout the procedure,  the patient's blood pressure, pulse, and oxygen                        saturations were monitored continuously. The was                        introduced through the mouth, and advanced to the                        second part of  duodenum. The upper GI endoscopy was                        accomplished without difficulty. The patient tolerated                        the procedure well. Findings:      One benign-appearing, intrinsic mild stenosis was found at the       gastroesophageal junction. The stenosis was traversed. A TTS dilator was       passed through the scope. Dilation with a 15-16.5-18 mm balloon dilator       was performed to 18 mm. The dilation site was examined following       endoscope reinsertion and showed complete resolution of luminal       narrowing.      A single 9 mm sessile polyp with no bleeding and no stigmata of recent       bleeding was found in the gastric antrum. Biopsies were taken with a       cold forceps for histology.      The examined duodenum was normal. Impression:           - Benign-appearing esophageal stenosis. Dilated.                       - A single gastric polyp. Biopsied.                       - Normal examined duodenum. Recommendation:       - Discharge patient to home.                       - Resume previous diet.                       - Continue present medications.                       - Await pathology results.                       - If polyp bioppsies not diagnositic then patent may                        need an EUS for further evaluation. Procedure Code(s):    --- Professional ---                       [phone], Esophagogastroduodenoscopy, flexible, transoral;                        with transendoscopic balloon dilation of esophagus                        (  less than 30 mm diameter)                       43239, 59, Esophagogastroduodenoscopy, flexible,                        transoral; with biopsy, single or multiple Diagnosis Code(s):    --- Professional ---                       R13.10, Dysphagia, unspecified                       K31.7, Polyp of stomach and duodenum                       K22.2, Esophageal obstruction CPT copyright 2018 American Medical  Association. All rights reserved. The codes documented in this report are preliminary and upon coder review may  be revised to meet current compliance requirements. Lucilla Lame MD, MD 01/19/2019 9:07:54 AM This report has been signed electronically. Number of Addenda: 0 Note Initiated On: 01/19/2019 8:41 AM Total Procedure Duration: 0 hours 7 minutes 9 seconds       [facility]

## 2019-01-19 NOTE — Op Note (Signed)
[facility] Gastroenterology Patient Name: April Webb Procedure Date: 01/19/2019 8:39 AM MRN: [id number] Account #: [id number] Date of Birth: 1961/02/16 Admit Type: Outpatient Age: 58 Room: [facility] OR ROOM 01 Gender: Female Note Status: Finalized Procedure:            Colonoscopy Indications:          Family history of anal canal cancer in a first-degree                        relative Providers:            Lucilla Lame MD, MD Medicines:            Propofol per Anesthesia Complications:        No immediate complications. Procedure:            Pre-Anesthesia Assessment:                       - Prior to the procedure, a History and Physical was                        performed, and patient medications and allergies were                        reviewed. The patient's tolerance of previous                        anesthesia was also reviewed. The risks and benefits of                        the procedure and the sedation options and risks were                        discussed with the patient. All questions were                        answered, and informed consent was obtained. Prior                        Anticoagulants: The patient has taken no previous                        anticoagulant or antiplatelet agents. ASA Grade                        Assessment: II - A patient with mild systemic disease.                        After reviewing the risks and benefits, the patient was                        deemed in satisfactory condition to undergo the                        procedure.                       After obtaining informed consent, the colonoscope was                        passed under direct vision. Throughout the  procedure,                        the patient's blood pressure, pulse, and oxygen                        saturations were monitored continuously. The                        Colonoscope was introduced through the anus and   advanced to the the cecum, identified by appendiceal                        orifice and ileocecal valve. The colonoscopy was                        performed without difficulty. The patient tolerated the                        procedure well. The quality of the bowel preparation                        was excellent. Findings:      The perianal and digital rectal examinations were normal.      A 6 mm polyp was found in the ascending colon. The polyp was sessile.       The polyp was removed with a cold snare. Resection and retrieval were       complete.      A 5 mm polyp was found in the descending colon. The polyp was sessile.       The polyp was removed with a cold snare. Resection and retrieval were       complete.      A 3 mm polyp was found in the rectum. The polyp was sessile. The polyp       was removed with a cold snare. Resection and retrieval were complete.      Non-bleeding internal hemorrhoids were found during retroflexion. The       hemorrhoids were Grade I (internal hemorrhoids that do not prolapse). Impression:           - One 6 mm polyp in the ascending colon, removed with a                        cold snare. Resected and retrieved.                       - One 5 mm polyp in the descending colon, removed with                        a cold snare. Resected and retrieved.                       - One 3 mm polyp in the rectum, removed with a cold                        snare. Resected and retrieved.                       - Non-bleeding internal hemorrhoids. Recommendation:       - Discharge patient to home.                       -  Resume previous diet.                       - Continue present medications.                       - Await pathology results.                       - Repeat colonoscopy in 5 years for surveillance. Procedure Code(s):    --- Professional ---                       [phone], Colonoscopy, flexible; with removal of tumor(s),                        polyp(s),  or other lesion(s) by snare technique Diagnosis Code(s):    --- Professional ---                       Z80.0, Family history of malignant neoplasm of                        digestive organs                       K62.1, Rectal polyp                       D12.4, Benign neoplasm of descending colon                       D12.2, Benign neoplasm of ascending colon CPT copyright 2018 American Medical Association. All rights reserved. The codes documented in this report are preliminary and upon coder review may  be revised to meet current compliance requirements. Lucilla Lame MD, MD 01/19/2019 9:22:37 AM This report has been signed electronically. Number of Addenda: 0 Note Initiated On: 01/19/2019 8:39 AM Scope Withdrawal Time: 0 hours 9 minutes 53 seconds  Total Procedure Duration: 0 hours 11 minutes 40 seconds       [facility]

## 2019-01-19 NOTE — Anesthesia Procedure Notes (Signed)
Procedure Name: MAC Date/Time: 01/19/2019 8:53 AM Performed by: Janna Arch, CRNA Pre-anesthesia Checklist: Patient identified, Emergency Drugs available, Suction available and Patient being monitored Patient Re-evaluated:Patient Re-evaluated prior to induction Oxygen Delivery Method: Nasal cannula

## 2019-01-19 NOTE — Anesthesia Postprocedure Evaluation (Signed)
Anesthesia Post Note  Patient: April Webb  Procedure(s) Performed: COLONOSCOPY WITH BIOPSIES (N/A Rectum) ESOPHAGOGASTRODUODENOSCOPY (EGD) WITH BIOPSIES and DILATION (N/A Throat) ESOPHAGEAL DILATION (N/A Esophagus) POLYPECTOMY (N/A Rectum)  Patient location during evaluation: PACU Anesthesia Type: General Level of consciousness: awake and alert Pain management: pain level controlled Vital Signs Assessment: post-procedure vital signs reviewed and stable Respiratory status: spontaneous breathing Cardiovascular status: stable Anesthetic complications: no    Jaci Standard, III,  Ryliegh Mcduffey D

## 2019-01-19 NOTE — Anesthesia Preprocedure Evaluation (Addendum)
Anesthesia Evaluation  Patient identified by MRN, date of birth, ID band Patient awake    Reviewed: Allergy & Precautions, H&P , NPO status , Patient's Chart, lab work & pertinent test results  History of Anesthesia Complications Negative for: history of anesthetic complications  Airway Mallampati: II  TM Distance: >3 FB Neck ROM: full    Dental no notable dental hx.    Pulmonary neg pulmonary ROS,    Pulmonary exam normal breath sounds clear to auscultation       Cardiovascular hypertension, On Medications Normal cardiovascular exam Rhythm:regular Rate:Normal     Neuro/Psych negative neurological ROS     GI/Hepatic Neg liver ROS, GERD  ,  Endo/Other  diabetes, Well Controlled, Type 2  Renal/GU negative Renal ROS  negative genitourinary   Musculoskeletal   Abdominal   Peds  Hematology negative hematology ROS (+)   Anesthesia Other Findings   Reproductive/Obstetrics                            Anesthesia Physical Anesthesia Plan  ASA: II  Anesthesia Plan: General   Post-op Pain Management:    Induction:   PONV Risk Score and Plan:   Airway Management Planned:   Additional Equipment:   Intra-op Plan:   Post-operative Plan:   Informed Consent: I have reviewed the patients History and Physical, chart, labs and discussed the procedure including the risks, benefits and alternatives for the proposed anesthesia with the patient or authorized representative who has indicated his/her understanding and acceptance.       Plan Discussed with:   Anesthesia Plan Comments:         Anesthesia Quick Evaluation

## 2019-01-20 ENCOUNTER — Encounter: Payer: Self-pay | Admitting: Gastroenterology

## 2019-01-22 ENCOUNTER — Ambulatory Visit: Payer: BLUE CROSS/BLUE SHIELD | Admitting: Family Medicine

## 2019-01-23 ENCOUNTER — Telehealth: Payer: Self-pay

## 2019-01-23 NOTE — Telephone Encounter (Signed)
-----   Message from Lucilla Lame, MD sent at 01/21/2019  7:15 AM EDT ----- The patient know that the biopsies of the colon showed adenomas and she needs a repeat colonoscopy in 5 years.  The Lesion in the stomach did not show any diagnostic features on biopsy and the patient will need an EUS of this lesion.

## 2019-01-23 NOTE — Telephone Encounter (Signed)
LVM for pt to return my call.

## 2019-01-27 NOTE — Telephone Encounter (Signed)
Tried contacting pt today but voicemail box was full. Not able to leave message.

## 2019-01-28 NOTE — Telephone Encounter (Signed)
MyChart message has been sent to patient regarding her Colonoscopy and EGD results. Message in place to contact pt when hospital restriction has been released due to COVID-19.

## 2019-02-10 ENCOUNTER — Ambulatory Visit (INDEPENDENT_AMBULATORY_CARE_PROVIDER_SITE_OTHER): Payer: BLUE CROSS/BLUE SHIELD | Admitting: Family Medicine

## 2019-02-10 ENCOUNTER — Other Ambulatory Visit: Payer: Self-pay

## 2019-02-10 ENCOUNTER — Encounter: Payer: Self-pay | Admitting: Family Medicine

## 2019-02-10 DIAGNOSIS — E119 Type 2 diabetes mellitus without complications: Secondary | ICD-10-CM

## 2019-02-10 MED ORDER — GLIPIZIDE 5 MG PO TABS: 5.0000 mg | ORAL_TABLET | Freq: Two times a day (BID) | ORAL | 1 refills | Status: DC

## 2019-02-10 MED ORDER — METFORMIN HCL 500 MG PO TABS: 500.0000 mg | ORAL_TABLET | Freq: Two times a day (BID) | ORAL | 1 refills | Status: DC

## 2019-02-10 NOTE — Patient Instructions (Signed)
This information is directly available on the CDC website: RunningShows.co.za.html    Source:CDC Reference to specific commercial products, manufacturers, companies, or trademarks does not constitute its endorsement or recommendation by the [place], [place], or Centers for [employer] and Prevention. GUIDELINES FOR  LOW-CHOLESTEROL, LOW-TRIGLYCERIDE DIETS    FOODS TO USE   MEATS, FISH Choose lean meats (chicken, [place], veal, and non-fatty cuts of beef with excess fat trimmed; one serving = 3 oz of cooked meat). Also, fresh or frozen fish, canned fish packed in water, and shellfish (lobster, crabs, shrimp, and oysters). Limit use to no more than one serving of one of these per week. Shellfish are high in cholesterol but low in saturated fat and should be used sparingly. Meats and fish should be broiled (pan or oven) or baked on a rack.  EGGS Egg substitutes and egg whites (use freely). Egg yolks (limit two per week).  FRUITS Eat three servings of fresh fruit per day (1 serving =  cup). Be sure to have at least one citrus fruit daily. Frozen and canned fruit with no sugar or syrup added may be used.  VEGETABLES Most vegetables are not limited (see next page). One dark-green (string beans, escarole) or one deep yellow (squash) vegetable is recommended daily. Cauliflower, broccoli, and celery, as well as potato skins, are recommended for their fiber content. (Fiber is associated with cholesterol reduction) It is preferable to steam vegetables, but they may be boiled, strained, or braised with polyunsaturated vegetable oil (see below).  BEANS Dried peas or beans (1 serving =  cup) may be used as a bread substitute.  NUTS Almonds, walnuts, and peanuts may be used sparingly  (1 serving = 1 Tablespoonful). Use pumpkin, sesame, or sunflower seeds.  BREADS, GRAINS One roll or one slice of whole grain or  enriched bread may be used, or three soda crackers or four pieces of melba toast as a substitute. Spaghetti, rice or noodles ( cup) or  large ear of corn may be used as a bread substitute. In preparing these foods do not use butter or shortening, use soft margarine. Also use egg and sugar substitutes.  Choose high fiber grains, such as oats and whole wheat.  CEREALS Use  cup of hot cereal or  cup of cold cereal per day. Add a sugar substitute if desired, with 99% fat free or skim milk.  MILK PRODUCTS Always use 99% fat free or skim milk, dairy products such as low fat cheeses (farmer's uncreamed diet cottage), low-fat yogurt, and powdered skim milk.  FATS, OILS Use soft (not stick) margarine; vegetable oils that are high in polyunsaturated fats (such as safflower, sunflower, soybean, corn, and cottonseed). Always refrigerate meat drippings to harden the fat and remove it before preparing gravies  DESSERTS, SNACKS Limit to two servings per day; substitute each serving for a bread/cereal serving: ice milk, water sherbet (1/4 cup); unflavored gelatin or gelatin flavored with sugar substitute (1/3 cup); pudding prepared with skim milk (1/2 cup); egg white souffls; unbuttered popcorn (1  cups). Substitute carob for chocolate.  BEVERAGES Fresh fruit juices (limit 4 oz per day); black coffee, plain or herbal teas; soft drinks with sugar substitutes; club soda, preferably salt-free; cocoa made with skim milk or nonfat dried milk and water (sugar substitute added if desired); clear broth. Alcohol: limit two servings per day (see second page).  MISCELLANEOUS  You may use the following freely: vinegar, spices, herbs, nonfat bouillon, mustard, Worcestershire sauce, soy  sauce, flavoring essence.                  GUIDELINES FOR  LOW-CHOLESTEROL, LOW TRIGLYCERIDE DIETS    FOODS TO AVOID   MEATS, FISH Marbled beef, pork, bacon, sausage, and other pork products; fatty fowl (duck, goose); skin and  fat of [place] and chicken; processed meats; luncheon meats (salami, bologna); frankfurters and fast-food hamburgers (theyre loaded with fat); organ meats (kidneys, liver); canned fish packed in oil.  EGGS Limit egg yolks to two per week.   FRUITS Coconuts (rich in saturated fats).  VEGETABLES Avoid avocados. Starchy vegetables (potatoes, corn, lima beans, dried peas, beans) may be used only if substitutes for a serving of bread or cereal. (Baked potato skin, however, is desirable for its fiber content.  BEANS Commercial baked beans with sugar and/or pork added.  NUTS Avoid nuts.  Limit peanuts and walnuts to one tablespoonful per day.  BREADS, GRAINS Any baked goods with shortening and/or sugar. Commercial mixes with dried eggs and whole milk. Avoid sweet rolls, doughnuts, breakfast pastries (Danish), and sweetened packaged cereals (the added sugar converts readily to triglycerides).  MILK PRODUCTS Whole milk and whole-milk packaged goods; cream; ice cream; whole-milk puddings, yogurt, or cheeses; nondairy cream substitutes.  FATS, OILS Butter, lard, animal fats, bacon drippings, gravies, cream sauces as well as palm and coconut oils. All these are high in saturated fats. Examine labels on cholesterol free products for hydrogenated fats. (These are oils that have been hardened into solids and in the process have become saturated.)  DESSERTS, SNACKS Fried snack foods like potato chips; chocolate; candies in general; jams, jellies, syrups; whole- milk puddings; ice cream and milk sherbets; hydrogenated peanut butter.  BEVERAGES Sugared fruit juices and soft drinks; cocoa made with whole milk and/or sugar. When using alcohol (1 oz liquor, 5 oz beer, or 2  oz dry table wine per serving), one serving must be substituted for one bread or cereal serving (limit, two servings of alcohol per day).   SPECIAL NOTES    1. Remember that even non-limited foods should be used in moderation. 2. While on a  cholesterol-lowering diet, be sure to avoid animal fats and marbled meats. 3. 3. While on a triglyceride-lowering diet, be sure to avoid sweets and to control the amount of carbohydrates you eat (starchy foods such as flour, bread, potatoes).While on a tri-glyceride-lowering diet, be sure to avoid sweets 4. Buy a good low-fat cookbook, such as the one published by the American Heart Association. 5. Consult your physician if you have any questions.               [facility] Low Glycemic Diet Plan   Low Glycemic Foods (20-49) Moderate Glycemic Foods (50-69) High Glycemic Foods (70-100)      Breakfast Creals Breakfast Cereals Breakfast Cereals  All Bran All-Bran Fruit'n Oats   Bran Buds Bran Chex   Cheerios Corn chex    Fiber One Oatmeal (not instant)   Just Right Mini-Wheats   Corn Flakes Cream of Wheat    Oat Bran Special K Swiss Muesli   Grape Nuts Grape Nut Flakes      Grits Nutri-Grain    Fruits and fruit juice: Fruits Puffed Rice Puffed Wheat    (Limit to 1-2 Servings per day) Banana (under-ride) Dates   Rice Chex Rice Krispies    Apples Apricots (fresh/dried)   Figs Grapes   Shredded Wheat Team    Blackberries Blueberries   Kiwi Mango  Total     Cherries Cranberries   Oranges Raisins     Peaches Pears    Fruits  Plums Prunes   Fruit Juices Pineapple Watermelon    Grapefruit Raspberries   Cranberry Juice Orange Juice   Banana (over-ripe)     Strawberries Tangerines      Apple Juice Grapefruit Juice   Beans and Legumes Beverages  Tomato Juice    [place]-type baked beans Sodas, sweet tea, pineapple juice   Canned pinto, kidney, or navy beans   Beans and Legumes (fresh-cooked) Green peas Vegetables  Black-eyed peas Butter Beans    Potato, baked, boiled, fried, mashed  Chick peas Lentils   Vegetables [place] fries  Green beans Lima beans   Beets Carrots   Canned or frozen corn  Kidney beans Navy beans   Sweet potato Yam   Parsnips   Pinto beans Snow peas   Corn on the cob Winter squash      Non-starchy vegetables Grains Breads  Asparagus, avocado, broccoli, cabbage Cornmeal Rice, brown   Most breads (white and whole grain)  cauliflower, celery, cucumber, greens Rice, white Couscous   Bagels Bread sticks    lettuce, mushrooms, peppers, tomatoes  Bread stuffing Kaiser roll    okra, onions, spinach, summer squash Pasta Dinner rolls   [employer], cheese     Grains Ravioli, meat filled Spaghetti, white   Grains  Barley Bulgur    Rice, instant Tapioca, with milk    Rye Wild rice   Nuts    Cashews Macadamia   Candy and most cookies  Nuts and oils    Almonds, peanuts, sunflower seeds Snacks Snacks  hazelnuts, pecans, walnuts Chocolate Ice cream, lowfat   Donuts Corn chips    Oils that are liquid at room temperature Muffin Popcorn   Jelly beans Pretzels      Pastries  Dairy, fish, meat, soy, and eggs    Milk, skim Lowfat cheese    Restaurant and ethnic foods  Yogurt, lowfat, fruit sugar sweetened  Most [place] food (sugar in stir fry    or wok sauce)  Lean red meat Fish    Teriyaki-style meats and vegetables  Skinless chicken and [place], shellfish        Egg whites (up to 3 daily), Soy Products    Egg yolks (up to 7 or _____ per week)

## 2019-02-10 NOTE — Progress Notes (Signed)
Date:  02/10/2019   Name:  April Webb   DOB:  January 30, 1961   MRN:  [id number]   Chief Complaint: Diabetes (follow up diab- A1C was 11.4- needs recheck)  Diabetes  She presents for her follow-up diabetic visit. She has type 2 diabetes mellitus. Her disease course has been stable. There are no hypoglycemic associated symptoms. Pertinent negatives for hypoglycemia include no dizziness, headaches or nervousness/anxiousness. There are no diabetic associated symptoms. Pertinent negatives for diabetes include no fatigue, no polydipsia, no polyphagia, no polyuria and no weakness. There are no hypoglycemic complications. Symptoms are stable. There are no diabetic complications. Risk factors for coronary artery disease include diabetes mellitus. Current diabetic treatment includes oral agent (dual therapy). She is compliant with treatment all of the time. She is following a generally healthy diet. Meal planning includes avoidance of concentrated sweets and carbohydrate counting. She participates in exercise daily (walking 10,000 steps goal). Her home blood glucose trend is decreasing steadily. Her breakfast blood glucose is taken between 8-9 am. Her breakfast blood glucose range is generally 110-130 mg/dl. An ACE inhibitor/angiotensin II receptor blocker is not being taken. She does not see a podiatrist.Eye exam is not current.    Review of Systems  Constitutional: Negative.  Negative for chills, fatigue, fever and unexpected weight change.  HENT: Negative for congestion, ear discharge, ear pain, rhinorrhea, sinus pressure, sneezing and sore throat.   Eyes: Negative for photophobia, pain, discharge, redness and itching.  Respiratory: Negative for cough, shortness of breath, wheezing and stridor.   Gastrointestinal: Negative for abdominal pain, blood in stool, constipation, diarrhea, nausea and vomiting.  Endocrine: Negative for cold intolerance, heat intolerance, polydipsia, polyphagia and polyuria.   Genitourinary: Negative for dysuria, flank pain, frequency, hematuria, menstrual problem, pelvic pain, urgency, vaginal bleeding and vaginal discharge.  Musculoskeletal: Negative for arthralgias, back pain and myalgias.  Skin: Negative for rash.  Allergic/Immunologic: Negative for environmental allergies and food allergies.  Neurological: Negative for dizziness, weakness, light-headedness, numbness and headaches.  Hematological: Negative for adenopathy. Does not bruise/bleed easily.  Psychiatric/Behavioral: Negative for dysphoric mood. The patient is not nervous/anxious.     Patient Active Problem List   Diagnosis Date Noted  . Gastric polyp   . Problems with swallowing and mastication   . Family history of malignant neoplasm of gastrointestinal tract   . Rectal polyp   . Benign neoplasm of descending colon   . Benign neoplasm of ascending colon   . Type 2 diabetes mellitus without complication, without long-term current use of insulin ([place]) 12/11/2018  . Vaginal atrophy 04/24/2017    No Known Allergies  Past Surgical History:  Procedure Laterality Date  . ABDOMINAL HYSTERECTOMY    . BREAST BIOPSY Left 09/22/2009   us/ bx- neg  . BREAST CYST EXCISION Left    neg  . CESAREAN SECTION    . COLONOSCOPY WITH PROPOFOL N/A 01/19/2019   Procedure: COLONOSCOPY WITH BIOPSIES;  Surgeon: Lucilla Lame, MD;  Location: [place];  Service: Endoscopy;  Laterality: N/A;  . ESOPHAGEAL DILATION N/A 01/19/2019   Procedure: ESOPHAGEAL DILATION;  Surgeon: Lucilla Lame, MD;  Location: [place];  Service: Endoscopy;  Laterality: N/A;  . ESOPHAGOGASTRODUODENOSCOPY (EGD) WITH PROPOFOL N/A 01/19/2019   Procedure: ESOPHAGOGASTRODUODENOSCOPY (EGD) WITH BIOPSIES and DILATION;  Surgeon: Lucilla Lame, MD;  Location: [place];  Service: Endoscopy;  Laterality: N/A;  Diabetic - oral meds  . LAPAROTOMY    . OOPHORECTOMY    . OVARIAN CYST REMOVAL    .  POLYPECTOMY N/A 01/19/2019    Procedure: POLYPECTOMY;  Surgeon: Lucilla Lame, MD;  Location: [place];  Service: Endoscopy;  Laterality: N/A;    Social History   Tobacco Use  . Smoking status: Never Smoker  . Smokeless tobacco: Never Used  Substance Use Topics  . Alcohol use: Yes    Frequency: Never    Comment: occassionally - Holidays  . Drug use: No     Medication list has been reviewed and updated.  Current Meds  Medication Sig  . cholecalciferol (VITAMIN D3) 25 MCG (1000 UT) tablet Take 1,000 Units by mouth daily.  Marland Kitchen glipiZIDE (GLUCOTROL) 5 MG tablet Take 1 tablet (5 mg total) by mouth 2 (two) times daily before a meal.  . glucose blood (ONETOUCH VERIO) test strip Use once daily. Use as instructed.  . hydrochlorothiazide (MICROZIDE) 12.5 MG capsule Take 1 capsule (12.5 mg total) by mouth daily.  . metFORMIN (GLUCOPHAGE) 500 MG tablet Take 1 tablet (500 mg total) by mouth 2 (two) times daily with a meal.  . VITAMIN E PO Take by mouth daily.  . [DISCONTINUED] Na Sulfate-K Sulfate-Mg Sulf (SUPREP BOWEL PREP KIT) 17.5-3.13-1.6 GM/177ML SOLN Take 1 kit by mouth as directed.    PHQ 2/9 Scores 10/30/2018  PHQ - 2 Score 0  PHQ- 9 Score 0    BP Readings from Last 3 Encounters:  02/10/19 132/68  01/19/19 132/75  01/12/19 (!) 150/74    Physical Exam Vitals signs and nursing note reviewed.  Constitutional:      General: She is not in acute distress.    Appearance: She is not diaphoretic.  HENT:     Head: Normocephalic and atraumatic.     Right Ear: Tympanic membrane, ear canal and external ear normal.     Left Ear: Tympanic membrane, ear canal and external ear normal.     Nose: Nose normal. No congestion or rhinorrhea.  Eyes:     General:        Right eye: No discharge.        Left eye: No discharge.     Conjunctiva/sclera: Conjunctivae normal.     Pupils: Pupils are equal, round, and reactive to light.  Neck:     Musculoskeletal: Normal range of motion and neck supple. No neck  rigidity.     Thyroid: No thyromegaly.     Vascular: No carotid bruit or JVD.  Cardiovascular:     Rate and Rhythm: Normal rate and regular rhythm.     Heart sounds: Normal heart sounds. No murmur. No friction rub. No gallop.   Pulmonary:     Effort: Pulmonary effort is normal. No respiratory distress.     Breath sounds: Normal breath sounds. No stridor. No wheezing, rhonchi or rales.  Chest:     Chest wall: No tenderness.  Abdominal:     General: Bowel sounds are normal. There is no distension.     Palpations: Abdomen is soft. There is no mass.     Tenderness: There is no abdominal tenderness. There is no right CVA tenderness, left CVA tenderness, guarding or rebound.     Hernia: No hernia is present.  Musculoskeletal: Normal range of motion.  Lymphadenopathy:     Cervical: No cervical adenopathy.  Skin:    General: Skin is warm and dry.  Neurological:     Mental Status: She is alert.     Deep Tendon Reflexes: Reflexes are normal and symmetric.     Wt Readings from Last 3 Encounters:  02/10/19 185 lb (83.9 kg)  01/19/19 184 lb 8 oz (83.7 kg)  01/12/19 187 lb 3.2 oz (84.9 kg)    BP 132/68   Pulse 72   Ht '5\' 6"'$  (1.676 m)   Wt 185 lb (83.9 kg)   BMI 29.86 kg/m   Assessment and Plan: 1. Type 2 diabetes mellitus without complication, without long-term current use of insulin (H[facility]) Chronic.  Controlled.  The last week of readings have been in the 120-130 range in fact this morning was 93 mg percent patient will continue metformin 500 twice a day and glipizide 5 mg twice a day patient was given a [facility] diet.  Will recheck lab work in 4 weeks including renal panel lipid and A1c.  Will recheck patient in 6 months. - metFORMIN (GLUCOPHAGE) 500 MG tablet; Take 1 tablet (500 mg total) by mouth 2 (two) times daily with a meal.  Dispense: 180 tablet; Refill: 1 - glipiZIDE (GLUCOTROL) 5 MG tablet; Take 1 tablet (5 mg total) by mouth 2 (two) times daily before a meal.   Dispense: 180 tablet; Refill: 1

## 2019-02-26 HISTORY — PX: BREAST BIOPSY: SHX20

## 2019-03-10 ENCOUNTER — Other Ambulatory Visit: Payer: BLUE CROSS/BLUE SHIELD

## 2019-03-10 DIAGNOSIS — E119 Type 2 diabetes mellitus without complications: Secondary | ICD-10-CM | POA: Diagnosis not present

## 2019-03-11 LAB — HEMOGLOBIN A1C
Est. average glucose Bld gHb Est-mCnc: 200 mg/dL
Hgb A1c MFr Bld: 8.6 % — ABNORMAL HIGH (ref 4.8–5.6)

## 2019-03-12 ENCOUNTER — Other Ambulatory Visit: Payer: Self-pay

## 2019-03-12 DIAGNOSIS — E119 Type 2 diabetes mellitus without complications: Secondary | ICD-10-CM

## 2019-03-12 MED ORDER — GLIPIZIDE 5 MG PO TABS: 10.0000 mg | ORAL_TABLET | Freq: Two times a day (BID) | ORAL | 1 refills | Status: DC

## 2019-05-14 ENCOUNTER — Other Ambulatory Visit: Payer: BC Managed Care – PPO

## 2019-05-14 ENCOUNTER — Other Ambulatory Visit: Payer: Self-pay

## 2019-05-14 DIAGNOSIS — E119 Type 2 diabetes mellitus without complications: Secondary | ICD-10-CM

## 2019-05-15 ENCOUNTER — Other Ambulatory Visit: Payer: Self-pay

## 2019-05-15 DIAGNOSIS — E119 Type 2 diabetes mellitus without complications: Secondary | ICD-10-CM

## 2019-05-15 LAB — HEMOGLOBIN A1C
Est. average glucose Bld gHb Est-mCnc: 203 mg/dL
Hgb A1c MFr Bld: 8.7 % — ABNORMAL HIGH (ref 4.8–5.6)

## 2019-05-15 NOTE — Progress Notes (Unsigned)
Ref to endo 

## 2019-06-05 DIAGNOSIS — E1169 Type 2 diabetes mellitus with other specified complication: Secondary | ICD-10-CM | POA: Diagnosis not present

## 2019-06-05 DIAGNOSIS — E1159 Type 2 diabetes mellitus with other circulatory complications: Secondary | ICD-10-CM | POA: Diagnosis not present

## 2019-06-05 DIAGNOSIS — E1165 Type 2 diabetes mellitus with hyperglycemia: Secondary | ICD-10-CM | POA: Diagnosis not present

## 2019-06-05 DIAGNOSIS — I10 Essential (primary) hypertension: Secondary | ICD-10-CM | POA: Diagnosis not present

## 2019-06-06 LAB — HM DIABETES EYE EXAM

## 2019-06-09 ENCOUNTER — Other Ambulatory Visit: Payer: Self-pay

## 2019-06-11 ENCOUNTER — Telehealth: Payer: Self-pay

## 2019-06-11 NOTE — Telephone Encounter (Signed)
Received referral for EUS from Dr. Allen Norris. 44mm polyp in gastric antrum. Case sent to Dr. Cephas Darby for review. I have spoken with April Webb and if case is approved for [place], we can arrange for 8/20. She will be notified once case is reviewed.

## 2019-06-11 NOTE — Telephone Encounter (Signed)
Per Dr. Cephas Darby case would need to be performed at [facility]. I have notified Ms. Mcdade and she can expect a call from [facility] for scheduling and instructions.

## 2019-06-16 ENCOUNTER — Encounter: Payer: Self-pay | Admitting: Family Medicine

## 2019-06-17 ENCOUNTER — Other Ambulatory Visit: Payer: Self-pay | Admitting: Family Medicine

## 2019-06-17 DIAGNOSIS — E119 Type 2 diabetes mellitus without complications: Secondary | ICD-10-CM

## 2019-06-28 DIAGNOSIS — Z1159 Encounter for screening for other viral diseases: Secondary | ICD-10-CM | POA: Diagnosis not present

## 2019-06-28 DIAGNOSIS — Z01812 Encounter for preprocedural laboratory examination: Secondary | ICD-10-CM | POA: Diagnosis not present

## 2019-06-28 DIAGNOSIS — Z20828 Contact with and (suspected) exposure to other viral communicable diseases: Secondary | ICD-10-CM | POA: Diagnosis not present

## 2019-06-30 DIAGNOSIS — E1165 Type 2 diabetes mellitus with hyperglycemia: Secondary | ICD-10-CM | POA: Diagnosis not present

## 2019-06-30 DIAGNOSIS — K210 Gastro-esophageal reflux disease with esophagitis: Secondary | ICD-10-CM | POA: Diagnosis not present

## 2019-06-30 DIAGNOSIS — Z79899 Other long term (current) drug therapy: Secondary | ICD-10-CM | POA: Diagnosis not present

## 2019-06-30 DIAGNOSIS — Z7982 Long term (current) use of aspirin: Secondary | ICD-10-CM | POA: Diagnosis not present

## 2019-06-30 DIAGNOSIS — E785 Hyperlipidemia, unspecified: Secondary | ICD-10-CM | POA: Diagnosis not present

## 2019-06-30 DIAGNOSIS — K3189 Other diseases of stomach and duodenum: Secondary | ICD-10-CM | POA: Diagnosis not present

## 2019-06-30 DIAGNOSIS — E119 Type 2 diabetes mellitus without complications: Secondary | ICD-10-CM | POA: Diagnosis not present

## 2019-06-30 DIAGNOSIS — K317 Polyp of stomach and duodenum: Secondary | ICD-10-CM | POA: Diagnosis not present

## 2019-06-30 DIAGNOSIS — I10 Essential (primary) hypertension: Secondary | ICD-10-CM | POA: Diagnosis not present

## 2019-06-30 DIAGNOSIS — Z7984 Long term (current) use of oral hypoglycemic drugs: Secondary | ICD-10-CM | POA: Diagnosis not present

## 2019-07-12 DIAGNOSIS — G5712 Meralgia paresthetica, left lower limb: Secondary | ICD-10-CM | POA: Diagnosis not present

## 2019-07-20 ENCOUNTER — Other Ambulatory Visit: Payer: Self-pay

## 2019-07-20 ENCOUNTER — Ambulatory Visit (INDEPENDENT_AMBULATORY_CARE_PROVIDER_SITE_OTHER): Payer: BC Managed Care – PPO | Admitting: Family Medicine

## 2019-07-20 ENCOUNTER — Encounter: Payer: Self-pay | Admitting: Family Medicine

## 2019-07-20 VITALS — BP 130/80 | HR 76 | Ht 66.0 in | Wt 185.0 lb

## 2019-07-20 DIAGNOSIS — E119 Type 2 diabetes mellitus without complications: Secondary | ICD-10-CM

## 2019-07-20 DIAGNOSIS — M7062 Trochanteric bursitis, left hip: Secondary | ICD-10-CM | POA: Diagnosis not present

## 2019-07-20 DIAGNOSIS — E7801 Familial hypercholesterolemia: Secondary | ICD-10-CM

## 2019-07-20 DIAGNOSIS — I10 Essential (primary) hypertension: Secondary | ICD-10-CM

## 2019-07-20 DIAGNOSIS — E663 Overweight: Secondary | ICD-10-CM

## 2019-07-20 MED ORDER — MELOXICAM 7.5 MG PO TABS: 7.5000 mg | ORAL_TABLET | Freq: Every day | ORAL | 0 refills | Status: DC

## 2019-07-20 MED ORDER — GLIPIZIDE 5 MG PO TABS: 10.0000 mg | ORAL_TABLET | Freq: Two times a day (BID) | ORAL | 1 refills | Status: DC

## 2019-07-20 MED ORDER — HYDROCHLOROTHIAZIDE 12.5 MG PO CAPS: 12.5000 mg | ORAL_CAPSULE | Freq: Every day | ORAL | 1 refills | Status: DC

## 2019-07-20 MED ORDER — GLIPIZIDE 10 MG PO TABS: 10.0000 mg | ORAL_TABLET | Freq: Two times a day (BID) | ORAL | 1 refills | Status: DC

## 2019-07-20 MED ORDER — METFORMIN HCL 500 MG PO TABS: 500.0000 mg | ORAL_TABLET | Freq: Two times a day (BID) | ORAL | 1 refills | Status: AC

## 2019-07-20 NOTE — Patient Instructions (Signed)
Diabetes Mellitus and Nutrition, Adult When you have diabetes (diabetes mellitus), it is very important to have healthy eating habits because your blood sugar (glucose) levels are greatly affected by what you eat and drink. Eating healthy foods in the appropriate amounts, at about the same times every day, can help you:  Control your blood glucose.  Lower your risk of heart disease.  Improve your blood pressure.  Reach or maintain a healthy weight. Every person with diabetes is different, and each person has different needs for a meal plan. Your health care provider may recommend that you work with a diet and nutrition specialist (dietitian) to make a meal plan that is best for you. Your meal plan may vary depending on factors such as:  The calories you need.  The medicines you take.  Your weight.  Your blood glucose, blood pressure, and cholesterol levels.  Your activity level.  Other health conditions you have, such as heart or kidney disease. How do carbohydrates affect me? Carbohydrates, also called carbs, affect your blood glucose level more than any other type of food. Eating carbs naturally raises the amount of glucose in your blood. Carb counting is a method for keeping track of how many carbs you eat. Counting carbs is important to keep your blood glucose at a healthy level, especially if you use insulin or take certain oral diabetes medicines. It is important to know how many carbs you can safely have in each meal. This is different for every person. Your dietitian can help you calculate how many carbs you should have at each meal and for each snack. Foods that contain carbs include:  Bread, cereal, rice, pasta, and crackers.  Potatoes and corn.  Peas, beans, and lentils.  Milk and yogurt.  Fruit and juice.  Desserts, such as cakes, cookies, ice cream, and candy. How does alcohol affect me? Alcohol can cause a sudden decrease in blood glucose (hypoglycemia),  especially if you use insulin or take certain oral diabetes medicines. Hypoglycemia can be a life-threatening condition. Symptoms of hypoglycemia (sleepiness, dizziness, and confusion) are similar to symptoms of having too much alcohol. If your health care provider says that alcohol is safe for you, follow these guidelines:  Limit alcohol intake to no more than 1 drink per day for nonpregnant women and 2 drinks per day for men. One drink equals 12 oz of beer, 5 oz of wine, or 1 oz of hard liquor.  Do not drink on an empty stomach.  Keep yourself hydrated with water, diet soda, or unsweetened iced tea.  Keep in mind that regular soda, juice, and other mixers may contain a lot of sugar and must be counted as carbs. What are tips for following this plan?  Reading food labels  Start by checking the serving size on the "Nutrition Facts" label of packaged foods and drinks. The amount of calories, carbs, fats, and other nutrients listed on the label is based on one serving of the item. Many items contain more than one serving per package.  Check the total grams (g) of carbs in one serving. You can calculate the number of servings of carbs in one serving by dividing the total carbs by 15. For example, if a food has 30 g of total carbs, it would be equal to 2 servings of carbs.  Check the number of grams (g) of saturated and trans fats in one serving. Choose foods that have low or no amount of these fats.  Check the number of  milligrams (mg) of salt (sodium) in one serving. Most people should limit total sodium intake to less than 2,300 mg per day.  Always check the nutrition information of foods labeled as "low-fat" or "nonfat". These foods may be higher in added sugar or refined carbs and should be avoided.  Talk to your dietitian to identify your daily goals for nutrients listed on the label. Shopping  Avoid buying canned, premade, or processed foods. These foods tend to be high in fat, sodium,  and added sugar.  Shop around the outside edge of the grocery store. This includes fresh fruits and vegetables, bulk grains, fresh meats, and fresh dairy. Cooking  Use low-heat cooking methods, such as baking, instead of high-heat cooking methods like deep frying.  Cook using healthy oils, such as olive, canola, or sunflower oil.  Avoid cooking with butter, cream, or high-fat meats. Meal planning  Eat meals and snacks regularly, preferably at the same times every day. Avoid going long periods of time without eating.  Eat foods high in fiber, such as fresh fruits, vegetables, beans, and whole grains. Talk to your dietitian about how many servings of carbs you can eat at each meal.  Eat 4-6 ounces (oz) of lean protein each day, such as lean meat, chicken, fish, eggs, or tofu. One oz of lean protein is equal to: ? 1 oz of meat, chicken, or fish. ? 1 egg. ?  cup of tofu.  Eat some foods each day that contain healthy fats, such as avocado, nuts, seeds, and fish. Lifestyle  Check your blood glucose regularly.  Exercise regularly as told by your health care provider. This may include: ? 150 minutes of moderate-intensity or vigorous-intensity exercise each week. This could be brisk walking, biking, or water aerobics. ? Stretching and doing strength exercises, such as yoga or weightlifting, at least 2 times a week.  Take medicines as told by your health care provider.  Do not use any products that contain nicotine or tobacco, such as cigarettes and e-cigarettes. If you need help quitting, ask your health care provider.  Work with a Social worker or diabetes educator to identify strategies to manage stress and any emotional and social challenges. Questions to ask a health care provider  Do I need to meet with a diabetes educator?  Do I need to meet with a dietitian?  What number can I call if I have questions?  When are the best times to check my blood glucose? Where to find more  information:  American Diabetes Association: diabetes.org  Academy of Nutrition and Dietetics: www.eatright.[employer] of Diabetes and Digestive and Kidney Diseases (NIH): [employer] Summary  A healthy meal plan will help you control your blood glucose and maintain a healthy lifestyle.  Working with a diet and nutrition specialist (dietitian) can help you make a meal plan that is best for you.  Keep in mind that carbohydrates (carbs) and alcohol have immediate effects on your blood glucose levels. It is important to count carbs and to use alcohol carefully. This information is not intended to replace advice given to you by your health care provider. Make sure you discuss any questions you have with your health care provider. Document Released: 07/19/2005 Document Revised: 10/04/2017 Document Reviewed: 11/26/2016 Elsevier Patient Education  2020 [place] for Diabetes Mellitus, Adult  Carbohydrate counting is a method of keeping track of how many carbohydrates you eat. Eating carbohydrates naturally increases the amount of sugar (glucose) in the blood. Counting how many  carbohydrates you eat helps keep your blood glucose within normal limits, which helps you manage your diabetes (diabetes mellitus). It is important to know how many carbohydrates you can safely have in each meal. This is different for every person. A diet and nutrition specialist (registered dietitian) can help you make a meal plan and calculate how many carbohydrates you should have at each meal and snack. Carbohydrates are found in the following foods:  Grains, such as breads and cereals.  Dried beans and soy products.  Starchy vegetables, such as potatoes, peas, and corn.  Fruit and fruit juices.  Milk and yogurt.  Sweets and snack foods, such as cake, cookies, candy, chips, and soft drinks. How do I count carbohydrates? There are two ways to count carbohydrates in  food. You can use either of the methods or a combination of both. Reading "Nutrition Facts" on packaged food The "Nutrition Facts" list is included on the labels of almost all packaged foods and beverages in the [place] It includes:  The serving size.  Information about nutrients in each serving, including the grams (g) of carbohydrate per serving. To use the Nutrition Facts":  Decide how many servings you will have.  Multiply the number of servings by the number of carbohydrates per serving.  The resulting number is the total amount of carbohydrates that you will be having. Learning standard serving sizes of other foods When you eat carbohydrate foods that are not packaged or do not include "Nutrition Facts" on the label, you need to measure the servings in order to count the amount of carbohydrates:  Measure the foods that you will eat with a food scale or measuring cup, if needed.  Decide how many standard-size servings you will eat.  Multiply the number of servings by 15. Most carbohydrate-rich foods have about 15 g of carbohydrates per serving. ? For example, if you eat 8 oz (170 g) of strawberries, you will have eaten 2 servings and 30 g of carbohydrates (2 servings x 15 g = 30 g).  For foods that have more than one food mixed, such as soups and casseroles, you must count the carbohydrates in each food that is included. The following list contains standard serving sizes of common carbohydrate-rich foods. Each of these servings has about 15 g of carbohydrates:   hamburger bun or  English muffin.   oz (15 mL) syrup.   oz (14 g) jelly.  1 slice of bread.  1 six-inch tortilla.  3 oz (85 g) cooked rice or pasta.  4 oz (113 g) cooked dried beans.  4 oz (113 g) starchy vegetable, such as peas, corn, or potatoes.  4 oz (113 g) hot cereal.  4 oz (113 g) mashed potatoes or  of a large baked potato.  4 oz (113 g) canned or frozen fruit.  4 oz (120 mL) fruit  juice.  4-6 crackers.  6 chicken nuggets.  6 oz (170 g) unsweetened dry cereal.  6 oz (170 g) plain fat-free yogurt or yogurt sweetened with artificial sweeteners.  8 oz (240 mL) milk.  8 oz (170 g) fresh fruit or one small piece of fruit.  24 oz (680 g) popped popcorn. Example of carbohydrate counting Sample meal  3 oz (85 g) chicken breast.  6 oz (170 g) brown rice.  4 oz (113 g) corn.  8 oz (240 mL) milk.  8 oz (170 g) strawberries with sugar-free whipped topping. Carbohydrate calculation 1. Identify the foods that contain carbohydrates: ? Rice. ?  Corn. ? Milk. ? Strawberries. 2. Calculate how many servings you have of each food: ? 2 servings rice. ? 1 serving corn. ? 1 serving milk. ? 1 serving strawberries. 3. Multiply each number of servings by 15 g: ? 2 servings rice x 15 g = 30 g. ? 1 serving corn x 15 g = 15 g. ? 1 serving milk x 15 g = 15 g. ? 1 serving strawberries x 15 g = 15 g. 4. Add together all of the amounts to find the total grams of carbohydrates eaten: ? 30 g + 15 g + 15 g + 15 g = 75 g of carbohydrates total. Summary  Carbohydrate counting is a method of keeping track of how many carbohydrates you eat.  Eating carbohydrates naturally increases the amount of sugar (glucose) in the blood.  Counting how many carbohydrates you eat helps keep your blood glucose within normal limits, which helps you manage your diabetes.  A diet and nutrition specialist (registered dietitian) can help you make a meal plan and calculate how many carbohydrates you should have at each meal and snack. This information is not intended to replace advice given to you by your health care provider. Make sure you discuss any questions you have with your health care provider. Document Released: 10/22/2005 Document Revised: 05/16/2017 Document Reviewed: 04/04/2016 Elsevier Patient Education  2020 [employer].

## 2019-07-20 NOTE — Progress Notes (Signed)
Date:  07/20/2019   Name:  April Webb   DOB:  1960/12/15   MRN:  [id number]   Chief Complaint: Hypertension, Diabetes (93 this am), and Leg Pain (feels like a burning or numb feeling on l) upper leg- can press on abbomen and feels it in leg- bothers her more at night when lying on that side)  Hypertension This is a chronic problem. The current episode started more than 1 year ago. The problem has been gradually improving since onset. The problem is controlled. Pertinent negatives include no anxiety, blurred vision, chest pain, headaches, malaise/fatigue, neck pain, orthopnea, palpitations, peripheral edema, PND, shortness of breath or sweats. There are no associated agents to hypertension. Risk factors for coronary artery disease include dyslipidemia. Past treatments include diuretics. The current treatment provides mild improvement. There are no compliance problems.  There is no history of angina, kidney disease, CAD/MI, CVA, heart failure, left ventricular hypertrophy, PVD or retinopathy. There is no history of chronic renal disease, a hypertension causing med or renovascular disease.  Diabetes She presents for her follow-up diabetic visit. She has type 2 diabetes mellitus. Her disease course has been stable. Pertinent negatives for hypoglycemia include no confusion, dizziness, headaches, hunger, mood changes, nervousness/anxiousness, pallor, seizures, sleepiness, speech difficulty, sweats or tremors. Pertinent negatives for diabetes include no blurred vision, no chest pain, no fatigue, no foot paresthesias, no foot ulcerations, no polydipsia, no polyphagia, no polyuria, no visual change, no weakness and no weight loss. There are no hypoglycemic complications. Symptoms are stable. There are no diabetic complications. Pertinent negatives for diabetic complications include no CVA, nephropathy, peripheral neuropathy, PVD or retinopathy. Risk factors for coronary artery disease include diabetes  mellitus, dyslipidemia, hypertension and post-menopausal. Current diabetic treatment includes oral agent (dual therapy). She is compliant with treatment all of the time. Her weight is stable. She is following a generally healthy diet. Meal planning includes avoidance of concentrated sweets and carbohydrate counting. She participates in exercise daily. Her home blood glucose trend is fluctuating minimally. Her breakfast blood glucose is taken between 8-9 am. Her breakfast blood glucose range is generally 110-130 mg/dl. An ACE inhibitor/angiotensin II receptor blocker is not being taken.  Leg Pain  The incident occurred more than 1 week ago (4 weeks). The pain is present in the left leg and left thigh. The pain is at a severity of 8/10. The pain is moderate. Pertinent negatives include no inability to bear weight, loss of motion, loss of sensation, muscle weakness, numbness or tingling. She reports no foreign bodies present. The symptoms are aggravated by palpation. She has tried acetaminophen for the symptoms. The treatment provided moderate relief.  Hyperlipidemia This is a chronic problem. The current episode started more than 1 year ago. The problem is controlled. Recent lipid tests were reviewed and are normal. She has no history of chronic renal disease. Associated symptoms include leg pain. Pertinent negatives include no chest pain, myalgias or shortness of breath. Current antihyperlipidemic treatment includes diet change. There are no compliance problems.  Risk factors for coronary artery disease include diabetes mellitus, dyslipidemia and hypertension.    Review of Systems  Constitutional: Negative.  Negative for chills, fatigue, fever, malaise/fatigue, unexpected weight change and weight loss.  HENT: Negative for congestion, ear discharge, ear pain, rhinorrhea, sinus pressure, sneezing and sore throat.   Eyes: Negative for blurred vision, photophobia, pain, discharge, redness and itching.    Respiratory: Negative for cough, shortness of breath, wheezing and stridor.   Cardiovascular: Negative for  chest pain, palpitations, orthopnea and PND.  Gastrointestinal: Negative for abdominal pain, blood in stool, constipation, diarrhea, nausea and vomiting.  Endocrine: Negative for cold intolerance, heat intolerance, polydipsia, polyphagia and polyuria.  Genitourinary: Negative for dysuria, flank pain, frequency, hematuria, menstrual problem, pelvic pain, urgency, vaginal bleeding and vaginal discharge.  Musculoskeletal: Negative for arthralgias, back pain, myalgias and neck pain.  Skin: Negative for pallor and rash.  Allergic/Immunologic: Negative for environmental allergies and food allergies.  Neurological: Negative for dizziness, tingling, tremors, seizures, speech difficulty, weakness, light-headedness, numbness and headaches.  Hematological: Negative for adenopathy. Does not bruise/bleed easily.  Psychiatric/Behavioral: Negative for confusion and dysphoric mood. The patient is not nervous/anxious.     Patient Active Problem List   Diagnosis Date Noted   Gastric polyp    Problems with swallowing and mastication    Family history of malignant neoplasm of gastrointestinal tract    Rectal polyp    Benign neoplasm of descending colon    Benign neoplasm of ascending colon    Type 2 diabetes mellitus without complication, without long-term current use of insulin ([place]) 12/11/2018   Vaginal atrophy 04/24/2017    No Known Allergies  Past Surgical History:  Procedure Laterality Date   ABDOMINAL HYSTERECTOMY     BREAST BIOPSY Left 09/22/2009   us/ bx- neg   BREAST CYST EXCISION Left    neg   CESAREAN SECTION     COLONOSCOPY WITH PROPOFOL N/A 01/19/2019   Procedure: COLONOSCOPY WITH BIOPSIES;  Surgeon: Lucilla Lame, MD;  Location: [facility];  Service: Endoscopy;  Laterality: N/A;   ESOPHAGEAL DILATION N/A 01/19/2019   Procedure: ESOPHAGEAL DILATION;  Surgeon:  Lucilla Lame, MD;  Location: [facility];  Service: Endoscopy;  Laterality: N/A;   ESOPHAGOGASTRODUODENOSCOPY (EGD) WITH PROPOFOL N/A 01/19/2019   Procedure: ESOPHAGOGASTRODUODENOSCOPY (EGD) WITH BIOPSIES and DILATION;  Surgeon: Lucilla Lame, MD;  Location: [facility];  Service: Endoscopy;  Laterality: N/A;  Diabetic - oral meds   LAPAROTOMY     OOPHORECTOMY     OVARIAN CYST REMOVAL     POLYPECTOMY N/A 01/19/2019   Procedure: POLYPECTOMY;  Surgeon: Lucilla Lame, MD;  Location: [facility];  Service: Endoscopy;  Laterality: N/A;    Social History   Tobacco Use   Smoking status: Never Smoker   Smokeless tobacco: Never Used  Substance Use Topics   Alcohol use: Yes    Frequency: Never    Comment: occassionally - Holidays   Drug use: No     Medication list has been reviewed and updated.  Current Meds  Medication Sig   cholecalciferol (VITAMIN D3) 25 MCG (1000 UT) tablet Take 1,000 Units by mouth daily.   glipiZIDE (GLUCOTROL) 5 MG tablet Take 2 tablets (10 mg total) by mouth 2 (two) times daily before a meal.   glucose blood (ONETOUCH VERIO) test strip Use once daily. Use as instructed.   hydrochlorothiazide (MICROZIDE) 12.5 MG capsule Take 1 capsule (12.5 mg total) by mouth daily.   metFORMIN (GLUCOPHAGE) 500 MG tablet Take 1 tablet (500 mg total) by mouth 2 (two) times daily with a meal.   VITAMIN E PO Take by mouth daily.    PHQ 2/9 Scores 10/30/2018  PHQ - 2 Score 0  PHQ- 9 Score 0    BP Readings from Last 3 Encounters:  07/20/19 130/80  02/10/19 132/68  01/19/19 132/75    Physical Exam Vitals signs and nursing note reviewed.  Constitutional:      Appearance: She is well-developed.  HENT:     Head: Normocephalic.     Right Ear: Tympanic membrane, ear canal and external ear normal.     Left Ear: Tympanic membrane, ear canal and external ear normal.  Eyes:     General: Lids are everted, no foreign bodies appreciated. No scleral  icterus.       Left eye: No foreign body or hordeolum.     Conjunctiva/sclera: Conjunctivae normal.     Right eye: Right conjunctiva is not injected.     Left eye: Left conjunctiva is not injected.     Pupils: Pupils are equal, round, and reactive to light.  Neck:     Musculoskeletal: Normal range of motion and neck supple.     Thyroid: No thyromegaly.     Vascular: No JVD.     Trachea: No tracheal deviation.  Cardiovascular:     Rate and Rhythm: Normal rate and regular rhythm.     Heart sounds: Normal heart sounds. No murmur. No friction rub. No gallop.   Pulmonary:     Effort: Pulmonary effort is normal. No respiratory distress.     Breath sounds: Normal breath sounds. No wheezing or rales.  Abdominal:     General: Bowel sounds are normal.     Palpations: Abdomen is soft. There is no mass.     Tenderness: There is no abdominal tenderness. There is no guarding or rebound.  Musculoskeletal: Normal range of motion.     Left upper leg: She exhibits tenderness.     Comments: Tender over trochanteric bursa  Lymphadenopathy:     Cervical: No cervical adenopathy.  Skin:    General: Skin is warm.     Findings: No rash.  Neurological:     Mental Status: She is alert and oriented to person, place, and time.     Cranial Nerves: No cranial nerve deficit.     Deep Tendon Reflexes: Reflexes normal.  Psychiatric:        Mood and Affect: Mood is not anxious or depressed.     Wt Readings from Last 3 Encounters:  07/20/19 185 lb (83.9 kg)  02/10/19 185 lb (83.9 kg)  01/19/19 184 lb 8 oz (83.7 kg)    BP 130/80    Pulse 76    Ht 5\' 6"  (1.676 m)    Wt 185 lb (83.9 kg)    BMI 29.86 kg/m   Assessment and Plan: 1. Benign essential hypertension Chronic.  Controlled.  Continue hydrochlorothiazide 12.5 mg once a day.  Will check renal function panel. - hydrochlorothiazide (MICROZIDE) 12.5 MG capsule; Take 1 capsule (12.5 mg total) by mouth daily.  Dispense: 90 capsule; Refill: 1 - Renal  Function Panel  2. Type 2 diabetes mellitus without complication, without long-term current use of insulin (HCC) Chronic.  Controlled.  Continue glipizide 5 mg 2 tablets twice a day before meal and metformin 500 mg 1 twice a day.  Will check A1c microalbuminuria to consider if need to be on lisinopril and lipid panel to consider the possibility of statin we will also check an A1c to see in the area of which she is in. - glipiZIDE (GLUCOTROL) 5 MG tablet; Take 2 tablets (10 mg total) by mouth 2 (two) times daily before a meal.  Dispense: 180 tablet; Refill: 1 - metFORMIN (GLUCOPHAGE) 500 MG tablet; Take 1 tablet (500 mg total) by mouth 2 (two) times daily with a meal.  Dispense: 180 tablet; Refill: 1 - Lipid Panel With LDL/HDL Ratio - Hemoglobin A1c -  Microalbumin / creatinine urine ratio - Renal Function Panel  3. Familial hypercholesterolemia Chronic.  Controlled.  Patient has been discussed on diet control of her cholesterol in the face of her diabetes.  Will check lipid panel with LDL HDL ratio - Lipid Panel With LDL/HDL Ratio  4. Trochanteric bursitis of left hip New onset 4-week pain of the lateral aspect of the left thigh.  Exam is consistent with a trochanteric bursitis because of the tenderness over the area.  Will initiate meloxicam 7.5 mg once a day for 2 weeks if necessary may extend for 4 weeks. - meloxicam (MOBIC) 7.5 MG tablet; Take 1 tablet (7.5 mg total) by mouth daily.  Dispense: 30 tablet; Refill: 0  5. Overweight (BMI 25.0-29.9) Health risks of being over weight were discussed and patient was counseled on weight loss options and exercise. Diet sheet was given to count carbs for her diabetes is well.

## 2019-07-21 LAB — MICROALBUMIN / CREATININE URINE RATIO
Creatinine, Urine: 169.3 mg/dL
Microalb/Creat Ratio: 10 mg/g creat (ref 0–29)
Microalbumin, Urine: 16.9 ug/mL

## 2019-07-21 LAB — RENAL FUNCTION PANEL
Albumin: 4.8 g/dL (ref 3.8–4.9)
BUN/Creatinine Ratio: 18 (ref 9–23)
BUN: 12 mg/dL (ref 6–24)
CO2: 25 mmol/L (ref 20–29)
Calcium: 10.1 mg/dL (ref 8.7–10.2)
Chloride: 102 mmol/L (ref 96–106)
Creatinine, Ser: 0.66 mg/dL (ref 0.57–1.00)
GFR calc Af Amer: 114 mL/min/{1.73_m2} (ref >59)
GFR calc non Af Amer: 99 mL/min/{1.73_m2} (ref >59)
Glucose: 108 mg/dL — ABNORMAL HIGH (ref 65–99)
Phosphorus: 4.2 mg/dL (ref 3.0–4.3)
Potassium: 4.7 mmol/L (ref 3.5–5.2)
Sodium: 144 mmol/L (ref 134–144)

## 2019-07-21 LAB — LIPID PANEL WITH LDL/HDL RATIO
Cholesterol, Total: 169 mg/dL (ref 100–199)
HDL: 61 mg/dL (ref >39)
LDL Chol Calc (NIH): 86 mg/dL (ref 0–99)
LDL/HDL Ratio: 1.4 ratio (ref 0.0–3.2)
Triglycerides: 124 mg/dL (ref 0–149)
VLDL Cholesterol Cal: 22 mg/dL (ref 5–40)

## 2019-07-21 LAB — HEMOGLOBIN A1C
Est. average glucose Bld gHb Est-mCnc: 169 mg/dL
Hgb A1c MFr Bld: 7.5 % — ABNORMAL HIGH (ref 4.8–5.6)

## 2019-07-27 ENCOUNTER — Other Ambulatory Visit: Payer: Self-pay

## 2019-07-27 ENCOUNTER — Ambulatory Visit (INDEPENDENT_AMBULATORY_CARE_PROVIDER_SITE_OTHER): Payer: BC Managed Care – PPO | Admitting: Family Medicine

## 2019-07-27 ENCOUNTER — Encounter: Payer: Self-pay | Admitting: Family Medicine

## 2019-07-27 VITALS — BP 130/70 | HR 80 | Ht 66.0 in | Wt 184.0 lb

## 2019-07-27 DIAGNOSIS — M7632 Iliotibial band syndrome, left leg: Secondary | ICD-10-CM | POA: Diagnosis not present

## 2019-07-27 DIAGNOSIS — M7062 Trochanteric bursitis, left hip: Secondary | ICD-10-CM

## 2019-07-27 NOTE — Progress Notes (Signed)
Date:  07/27/2019   Name:  April Webb   DOB:  [birth year]   MRN:  [id number]   Chief Complaint: Follow-up (hip pain L) thigh pain radiating down to knee- meloxicam not helping, tried changing sides in bed- not helping either)  Hip Pain  The incident occurred more than 1 week ago. There was no injury mechanism. The pain is present in the left leg. The pain is at a severity of 8/10. The pain is severe. Associated symptoms include numbness. Pertinent negatives include no inability to bear weight, loss of motion, loss of sensation, muscle weakness or tingling. The symptoms are aggravated by movement, weight bearing and palpation. She has tried NSAIDs for the symptoms. The treatment provided mild relief.    Review of Systems  Constitutional: Negative for chills and fever.  HENT: Negative for drooling, ear discharge, ear pain and sore throat.   Respiratory: Negative for cough, shortness of breath and wheezing.   Cardiovascular: Negative for chest pain, palpitations and leg swelling.  Gastrointestinal: Negative for abdominal pain, blood in stool, constipation, diarrhea and nausea.  Endocrine: Negative for polydipsia.  Genitourinary: Negative for dysuria, frequency, hematuria and urgency.  Musculoskeletal: Negative for back pain, myalgias and neck pain.  Skin: Negative for rash.  Allergic/Immunologic: Negative for environmental allergies.  Neurological: Positive for numbness. Negative for dizziness, tingling and headaches.  Hematological: Does not bruise/bleed easily.  Psychiatric/Behavioral: Negative for suicidal ideas. The patient is not nervous/anxious.     Patient Active Problem List   Diagnosis Date Noted  . Gastric polyp   . Problems with swallowing and mastication   . Family history of malignant neoplasm of gastrointestinal tract   . Rectal polyp   . Benign neoplasm of descending colon   . Benign neoplasm of ascending colon   . Type 2 diabetes mellitus without complication,  without long-term current use of insulin ([place]) 12/11/2018  . Vaginal atrophy 04/24/2017    No Known Allergies  Past Surgical History:  Procedure Laterality Date  . ABDOMINAL HYSTERECTOMY    . BREAST BIOPSY Left 09/22/2009   us/ bx- neg  . BREAST CYST EXCISION Left    neg  . CESAREAN SECTION    . COLONOSCOPY WITH PROPOFOL N/A 01/19/2019   Procedure: COLONOSCOPY WITH BIOPSIES;  Surgeon: Lucilla Lame, MD;  Location: [facility];  Service: Endoscopy;  Laterality: N/A;  . ESOPHAGEAL DILATION N/A 01/19/2019   Procedure: ESOPHAGEAL DILATION;  Surgeon: Lucilla Lame, MD;  Location: [facility];  Service: Endoscopy;  Laterality: N/A;  . ESOPHAGOGASTRODUODENOSCOPY (EGD) WITH PROPOFOL N/A 01/19/2019   Procedure: ESOPHAGOGASTRODUODENOSCOPY (EGD) WITH BIOPSIES and DILATION;  Surgeon: Lucilla Lame, MD;  Location: [facility];  Service: Endoscopy;  Laterality: N/A;  Diabetic - oral meds  . LAPAROTOMY    . OOPHORECTOMY    . OVARIAN CYST REMOVAL    . POLYPECTOMY N/A 01/19/2019   Procedure: POLYPECTOMY;  Surgeon: Lucilla Lame, MD;  Location: [facility];  Service: Endoscopy;  Laterality: N/A;    Social History   Tobacco Use  . Smoking status: Never Smoker  . Smokeless tobacco: Never Used  Substance Use Topics  . Alcohol use: Yes    Frequency: Never    Comment: occassionally - Holidays  . Drug use: No     Medication list has been reviewed and updated.  Current Meds  Medication Sig  . aspirin EC 81 MG tablet Take 81 mg by mouth daily.  . cholecalciferol (VITAMIN D3) 25 MCG (1000 UT)  tablet Take 1,000 Units by mouth daily.  Marland Kitchen glipiZIDE (GLUCOTROL) 10 MG tablet Take 1 tablet (10 mg total) by mouth 2 (two) times daily before a meal.  . glucose blood (ONETOUCH VERIO) test strip Use once daily. Use as instructed.  . hydrochlorothiazide (MICROZIDE) 12.5 MG capsule Take 1 capsule (12.5 mg total) by mouth daily.  . meloxicam (MOBIC) 7.5 MG tablet Take 1 tablet (7.5 mg  total) by mouth daily.  . metFORMIN (GLUCOPHAGE) 500 MG tablet Take 1 tablet (500 mg total) by mouth 2 (two) times daily with a meal.  . VITAMIN E PO Take by mouth daily.    PHQ 2/9 Scores 10/30/2018  PHQ - 2 Score 0  PHQ- 9 Score 0    BP Readings from Last 3 Encounters:  07/27/19 130/70  07/20/19 130/80  02/10/19 132/68    Physical Exam Constitutional:      Appearance: She is well-developed.  HENT:     Head: Normocephalic.     Right Ear: Tympanic membrane, ear canal and external ear normal.     Left Ear: Tympanic membrane, ear canal and external ear normal.  Eyes:     General: Lids are everted, no foreign bodies appreciated. No scleral icterus.       Left eye: No foreign body or hordeolum.     Conjunctiva/sclera: Conjunctivae normal.     Right eye: Right conjunctiva is not injected.     Left eye: Left conjunctiva is not injected.     Pupils: Pupils are equal, round, and reactive to light.  Neck:     Musculoskeletal: Normal range of motion and neck supple.     Thyroid: No thyromegaly.     Vascular: No JVD.     Trachea: No tracheal deviation.  Cardiovascular:     Rate and Rhythm: Normal rate and regular rhythm.     Chest Wall: PMI is not displaced.     Heart sounds: Normal heart sounds, S1 normal and S2 normal. No murmur. No systolic murmur. No diastolic murmur. No friction rub. No gallop. No S3 or S4 sounds.   Pulmonary:     Effort: Pulmonary effort is normal. No respiratory distress.     Breath sounds: Normal breath sounds. No wheezing or rales.  Abdominal:     General: Bowel sounds are normal.     Palpations: Abdomen is soft. There is no mass.     Tenderness: There is no abdominal tenderness. There is no guarding or rebound.  Musculoskeletal: Normal range of motion.     Left hip: She exhibits tenderness. She exhibits normal range of motion, normal strength, no swelling, no crepitus and no deformity.       Legs:     Comments: Tender vicinity trochanteric bursa   Lymphadenopathy:     Cervical: No cervical adenopathy.  Skin:    General: Skin is warm.     Findings: No rash.  Neurological:     Mental Status: She is alert and oriented to person, place, and time.     Cranial Nerves: No cranial nerve deficit.     Deep Tendon Reflexes: Reflexes normal.  Psychiatric:        Mood and Affect: Mood is not anxious or depressed.     Wt Readings from Last 3 Encounters:  07/27/19 184 lb (83.5 kg)  07/20/19 185 lb (83.9 kg)  02/10/19 185 lb (83.9 kg)    BP 130/70   Pulse 80   Ht 5\' 6"  (1.676 m)   Wt  184 lb (83.5 kg)   BMI 29.70 kg/m   Assessment and Plan: 1. Trochanteric bursitis of left hip Patient has exam consistent with trochanteric bursitis.  Patient will continue meloxicam and we will refer to orthopedics for evaluation and further treatment. - Ambulatory referral to Orthopedic Surgery  2. Iliotibial band syndrome of left side Tenderness was noted to be a little more distally as well and from the trochanteric bursa.  This may be consistent with iliotibial band syndrome and will be addressed by orthopedics as well. - Ambulatory referral to Orthopedic Surgery

## 2019-07-27 NOTE — Patient Instructions (Signed)
Iliotibial Band Syndrome  Iliotibial band syndrome (ITBS) is a condition that often causes knee pain. It can also cause pain in the outside of your hip, thigh, and knee. The iliotibial band is a strip of tissue that runs from the outside of your hip and down your thigh to the outside of your knee. Repeatedly bending and straightening your knee can irritate the iliotibial band. What are the causes? This condition is caused by inflammation and irritation from the friction of the iliotibial band moving over the thigh bone (femur) when you repeatedly bend and straighten your knee. What increases the risk? This condition is more likely to develop in people who:  Frequently change elevation during their workouts.  Run very long distances.  Recently increased the length or intensity of their workouts.  Run downhill often, or just started running downhill.  Ride a bike very far or often. You may also be at greater risk if you start a new workout routine without first warming up or if you have a job that requires you to bend, squat, or climb frequently. What are the signs or symptoms? Symptoms of this condition include:  Pain along the outside of your knee that may be worse with activity, especially running or going up and down stairs.  A "snapping" sensation over your knee.  Swelling on the outside of your knee.  Pain or a feeling of tightness in your hip. How is this diagnosed? This condition is diagnosed based on your symptoms, medical history, and physical exam. You may also see a health care provider who specializes in reducing pain and increasing mobility (physical therapist). A physical therapist may do an exam to check your balance, movement, and way of walking or running (gait) to see whether the way you move could contribute to your injury. You may also have tests to measure your strength, flexibility, and range of motion. How is this treated? Treatment for this condition includes:   Resting and limiting exercise.  Returning to activities gradually.  Doing range-of-motion and strengthening exercises (physical therapy) as told by your health care provider.  Including low-impact activities, such as swimming, in your exercise routine. Follow these instructions at home:  If directed, apply ice to the injured area. ? Put ice in a plastic bag. ? Place a towel between your skin and the bag. ? Leave the ice on for 20 minutes, 2-3 times per day.  Return to your normal activities as told by your health care provider. Ask your health care provider what activities are safe for you.  Keep all follow-up visits with your health care provider. This is important. Contact a health care provider if:  Your pain does not improve or gets worse despite treatment. This information is not intended to replace advice given to you by your health care provider. Make sure you discuss any questions you have with your health care provider. Document Released: 04/13/2002 Document Revised: 10/04/2017 Document Reviewed: 11/23/2016 Elsevier Patient Education  2020 [employer]  

## 2019-07-30 ENCOUNTER — Telehealth: Payer: Self-pay

## 2019-07-30 NOTE — Telephone Encounter (Signed)
sched appt for April Webb on Sept 29 @ 8:30 in [place]- pt notified

## 2019-08-04 ENCOUNTER — Ambulatory Visit (INDEPENDENT_AMBULATORY_CARE_PROVIDER_SITE_OTHER): Payer: BLUE CROSS/BLUE SHIELD | Admitting: Obstetrics & Gynecology

## 2019-08-04 ENCOUNTER — Other Ambulatory Visit: Payer: Self-pay

## 2019-08-04 ENCOUNTER — Encounter: Payer: Self-pay | Admitting: Obstetrics & Gynecology

## 2019-08-04 VITALS — BP 130/80 | Ht 66.0 in | Wt 186.0 lb

## 2019-08-04 DIAGNOSIS — Z01419 Encounter for gynecological examination (general) (routine) without abnormal findings: Secondary | ICD-10-CM | POA: Diagnosis not present

## 2019-08-04 DIAGNOSIS — E559 Vitamin D deficiency, unspecified: Secondary | ICD-10-CM | POA: Diagnosis not present

## 2019-08-04 DIAGNOSIS — E1165 Type 2 diabetes mellitus with hyperglycemia: Secondary | ICD-10-CM | POA: Diagnosis not present

## 2019-08-04 DIAGNOSIS — G5712 Meralgia paresthetica, left lower limb: Secondary | ICD-10-CM | POA: Diagnosis not present

## 2019-08-04 DIAGNOSIS — Z1239 Encounter for other screening for malignant neoplasm of breast: Secondary | ICD-10-CM

## 2019-08-04 NOTE — Progress Notes (Signed)
HPI:      April Webb is a 58 y.o. [phone] who LMP was in the past due to having a hysterectomy, she presents today for her annual examination.  The patient has no complaints today. The patient is sexually active. Herlast pap: approximate date 2019 and was normal and last mammogram: approximate date 2019 and was normal.  The patient does perform self breast exams.  There is no notable family history of breast or ovarian cancer in her family. The patient is not taking hormone replacement therapy. Patient denies post-menopausal vaginal bleeding.   The patient has regular exercise: yes. The patient denies current symptoms of depression.    GYN Hx: Last Colonoscopy:few months ago; on 5 year plan ago. Normal.   Prior Vitamin D levels have been low; prior therapy and current maintenance therapy  PMHx: Past Medical History:  Diagnosis Date  . Diabetes mellitus without complication ([place])   . GERD (gastroesophageal reflux disease)   . Goiter   . Heart murmur    mentioned at age 52. never noticed again.  Marland Kitchen Hypertension    Past Surgical History:  Procedure Laterality Date  . ABDOMINAL HYSTERECTOMY    . BREAST BIOPSY Left 09/22/2009   us/ bx- neg  . BREAST CYST EXCISION Left    neg  . CESAREAN SECTION    . COLONOSCOPY WITH PROPOFOL N/A 01/19/2019   Procedure: COLONOSCOPY WITH BIOPSIES;  Surgeon: Lucilla Lame, MD;  Location: [place];  Service: Endoscopy;  Laterality: N/A;  . ESOPHAGEAL DILATION N/A 01/19/2019   Procedure: ESOPHAGEAL DILATION;  Surgeon: Lucilla Lame, MD;  Location: [place];  Service: Endoscopy;  Laterality: N/A;  . ESOPHAGOGASTRODUODENOSCOPY (EGD) WITH PROPOFOL N/A 01/19/2019   Procedure: ESOPHAGOGASTRODUODENOSCOPY (EGD) WITH BIOPSIES and DILATION;  Surgeon: Lucilla Lame, MD;  Location: [place];  Service: Endoscopy;  Laterality: N/A;  Diabetic - oral meds  . LAPAROTOMY    . OOPHORECTOMY    . OVARIAN CYST REMOVAL    . POLYPECTOMY N/A  01/19/2019   Procedure: POLYPECTOMY;  Surgeon: Lucilla Lame, MD;  Location: [place];  Service: Endoscopy;  Laterality: N/A;   Family History  Problem Relation Age of Onset  . Colon cancer Mother   . Hypertension Father   . Prostate cancer Father   . Breast cancer Neg Hx    Social History   Tobacco Use  . Smoking status: Never Smoker  . Smokeless tobacco: Never Used  Substance Use Topics  . Alcohol use: Yes    Frequency: Never    Comment: occassionally - Holidays  . Drug use: No    Current Outpatient Medications:  .  aspirin EC 81 MG tablet, Take 81 mg by mouth daily., Disp: , Rfl:  .  cholecalciferol (VITAMIN D3) 25 MCG (1000 UT) tablet, Take 1,000 Units by mouth daily., Disp: , Rfl:  .  glipiZIDE (GLUCOTROL) 10 MG tablet, Take 1 tablet (10 mg total) by mouth 2 (two) times daily before a meal., Disp: 180 tablet, Rfl: 1 .  glucose blood (ONETOUCH VERIO) test strip, Use once daily. Use as instructed., Disp: 100 each, Rfl: 2 .  hydrochlorothiazide (MICROZIDE) 12.5 MG capsule, Take 1 capsule (12.5 mg total) by mouth daily., Disp: 90 capsule, Rfl: 1 .  meloxicam (MOBIC) 7.5 MG tablet, Take 1 tablet (7.5 mg total) by mouth daily., Disp: 30 tablet, Rfl: 0 .  metFORMIN (GLUCOPHAGE) 500 MG tablet, Take 1 tablet (500 mg total) by mouth 2 (two) times daily with a  meal., Disp: 180 tablet, Rfl: 1 .  VITAMIN E PO, Take by mouth daily., Disp: , Rfl:  Allergies: Patient has no known allergies.  Review of Systems  Constitutional: Negative for chills, fever and malaise/fatigue.  HENT: Negative for congestion, sinus pain and sore throat.   Eyes: Negative for blurred vision and pain.  Respiratory: Negative for cough and wheezing.   Cardiovascular: Negative for chest pain and leg swelling.  Gastrointestinal: Negative for abdominal pain, constipation, diarrhea, heartburn, nausea and vomiting.  Genitourinary: Negative for dysuria, frequency, hematuria and urgency.  Musculoskeletal:  Negative for back pain, joint pain, myalgias and neck pain.  Skin: Negative for itching and rash.  Neurological: Negative for dizziness, tremors and weakness.  Endo/Heme/Allergies: Does not bruise/bleed easily.  Psychiatric/Behavioral: Negative for depression. The patient is not nervous/anxious and does not have insomnia.     Objective: BP 130/80   Ht 5\' 6"  (1.676 m)   Wt 186 lb (84.4 kg)   BMI 30.02 kg/m   Filed Weights   08/04/19 0801  Weight: 186 lb (84.4 kg)   Body mass index is 30.02 kg/m. Physical Exam Constitutional:      General: She is not in acute distress.    Appearance: She is well-developed.  Genitourinary:     Pelvic exam was performed with patient supine.     Vagina and rectum normal.     No lesions in the vagina.     No vaginal bleeding.     No right or left adnexal mass present.     Right adnexa not tender.     Left adnexa not tender.     Genitourinary Comments: Absent Uterus Absent cervix Vaginal cuff well healed  HENT:     Head: Normocephalic and atraumatic. No laceration.     Right Ear: Hearing normal.     Left Ear: Hearing normal.     Mouth/Throat:     Pharynx: Uvula midline.  Eyes:     Pupils: Pupils are equal, round, and reactive to light.  Neck:     Musculoskeletal: Normal range of motion and neck supple.     Thyroid: No thyromegaly.  Cardiovascular:     Rate and Rhythm: Normal rate and regular rhythm.     Heart sounds: No murmur. No friction rub. No gallop.   Pulmonary:     Effort: Pulmonary effort is normal. No respiratory distress.     Breath sounds: Normal breath sounds. No wheezing.  Chest:     Breasts:        Right: No mass, skin change or tenderness.        Left: No mass, skin change or tenderness.  Abdominal:     General: Bowel sounds are normal. There is no distension.     Palpations: Abdomen is soft.     Tenderness: There is no abdominal tenderness. There is no rebound.  Musculoskeletal: Normal range of motion.   Neurological:     Mental Status: She is alert and oriented to person, place, and time.     Cranial Nerves: No cranial nerve deficit.  Skin:    General: Skin is warm and dry.  Psychiatric:        Judgment: Judgment normal.  Vitals signs reviewed.     Assessment: Annual Exam 1. Women's annual routine gynecological examination   2. Screening for breast cancer     Plan:            1.  Vaginal Screening-  Pap smear schedule reviewed with  patient, up to date 2019  2. Breast screening- Exam annually and mammogram scheduled  3. Colonoscopy every 5 years (up to date), Hemoccult testing after age 45  4. Labs managed by PCP  Vit D level today  5. Counseling for hormonal therapy: none              6. FRAX - FRAX score for assessing the 10 year probability for fracture calculated and discussed today.  Based on age and score today, DEXA is not currently scheduled.    F/U  Return in about 1 year (around 08/03/2020) for Annual.  Barnett Applebaum, MD, [facility], [facility] 08/04/2019  8:06 AM

## 2019-08-04 NOTE — Patient Instructions (Signed)
PAP every 5 years (up to date) Mammogram every year, due in Dec 2020    Call [phone] to schedule at [facility] Colonoscopy every 5 years (up to date) Labs yearly (with PCP)    Continue taking Vitamin D and check levels periodically

## 2019-08-05 LAB — VITAMIN D 25 HYDROXY (VIT D DEFICIENCY, FRACTURES): Vit D, 25-Hydroxy: 24.4 ng/mL — ABNORMAL LOW (ref 30.0–100.0)

## 2019-08-12 ENCOUNTER — Ambulatory Visit: Payer: BLUE CROSS/BLUE SHIELD | Admitting: Family Medicine

## 2019-11-27 DIAGNOSIS — E1169 Type 2 diabetes mellitus with other specified complication: Secondary | ICD-10-CM | POA: Diagnosis not present

## 2019-11-27 DIAGNOSIS — E1165 Type 2 diabetes mellitus with hyperglycemia: Secondary | ICD-10-CM | POA: Diagnosis not present

## 2019-11-27 DIAGNOSIS — I10 Essential (primary) hypertension: Secondary | ICD-10-CM | POA: Diagnosis not present

## 2019-11-27 DIAGNOSIS — E1159 Type 2 diabetes mellitus with other circulatory complications: Secondary | ICD-10-CM | POA: Diagnosis not present

## 2019-12-31 ENCOUNTER — Encounter: Payer: Self-pay | Admitting: Dietician

## 2019-12-31 ENCOUNTER — Other Ambulatory Visit: Payer: Self-pay

## 2019-12-31 ENCOUNTER — Encounter: Payer: BC Managed Care – PPO | Attending: "Endocrinology | Admitting: Dietician

## 2019-12-31 VITALS — Ht 66.0 in | Wt 184.6 lb

## 2019-12-31 DIAGNOSIS — E119 Type 2 diabetes mellitus without complications: Secondary | ICD-10-CM | POA: Diagnosis not present

## 2019-12-31 DIAGNOSIS — R739 Hyperglycemia, unspecified: Secondary | ICD-10-CM | POA: Diagnosis not present

## 2019-12-31 NOTE — Progress Notes (Signed)
Medical Nutrition Therapy: Visit start time: [id number]  end time: 1800  Assessment:  Diagnosis: Type 2 DM, hyperglycemia Past medical history: HTN Psychosocial issues/ stress concerns: none  Preferred learning method:  . Auditory . Visual . Hands-on   Current weight: 184.6lbs Height: 5'6" Medications, supplements: reconciled list in medical record  Progress and evaluation:   Patient is testing BGs fasting or after meals (usually lunch) and reports varying results, highest readings in 200s (after meals)  She reports DM diagnosis several years ago  She would like to lose some weight and also seeks help with understanding healthy carb choices and intake.   She was laid off her job last year after 20+ years; currently taking care of grandchildren  Physical activity:  monitoring steps and reaches 10,000 steps 5x a week for the past 3 weeks.  Dietary Intake:  Usual eating pattern includes 3 meals and 2 snacks per day. Dining out frequency: 1 meals per week.  Breakfast: 1pc toast with 2 boiled eggs and 1/2 grapefruit; raisin bran with almonds (vanilla almond) and 2% milk Snack: atkins protein bar; almonds; cheese doodles; chips; fruit; occ 2-3 cookies -- tries to avoid Lunch: peanut butter sandwich occ with small amount jelly; grilled cheese sandwich; leftovers; salad with grilled chicken; tuna salad; small amount frozen pizza with salad Snack: same as am or none Supper: avoids fried foods, usu grilled/ baked ribs/ pulled chicken sandwich + veg + starch; sometimes avoids meat if fried Snack: same as am or none Beverages: water 32-64oz daily or more; occasional diet soda, less than once a week  Nutrition Care Education: Topics covered:  Basic nutrition: basic food groups, appropriate nutrient balance, appropriate meal and snack schedule, general nutrition guidelines    Weight control: identifying healthy weight, importance of low sugar and low fat choices, appropriate food portions,  estimated energy needs for weight loss at 1600kcal, provided guidance for 45% CHO, 25% protein, and 30% fat Advanced nutrition:  food label reading for carb, fiber  Diabetes:  appropriate meal and snack schedule, appropriate carb intake and balance, healthy carb choices, role of fiber, protein, fat; role of physical activity, stress, illness on BGs  Nutritional Diagnosis:  [place]-2.2 Altered nutrition-related laboratory As related to Type 2 diabetes.  As evidenced by recent HbA1C of 7.7%. -3.3 Overweight/obesity As related to history of excess calories and inadequate physical activity.  As evidenced by patient wtih current BMI of 29, working on diet and lifestyle changes to promote weight loss.  Intervention:   Instruction and discussion as noted above.  Patient has made positive lifestyle changes already and is motivated to continue.   Main goal is to continue with current healthy habits by making use of resources provided.   No follow-up scheduled at this time; patient will schedule later if needed.  Education Materials given:  . General diet guidelines for Diabetes . Plate Planner with food lists, sample meal pattern . sample menus . Goals/ instructions   Learner/ who was taught:  . Patient   Level of understanding: Marland Kitchen Verbalizes/ demonstrates competency   Demonstrated degree of understanding via:   Teach back Learning barriers: . None  Willingness to learn/ readiness for change: . Eager, change in progress   Monitoring and Evaluation:  Dietary intake, exercise, BG control, and body weight      follow up: prn

## 2019-12-31 NOTE — Patient Instructions (Signed)
   Great job controlling carbs and making healthy food choices.   Use resources provided to maintain healthy balance of nutrition with meals.   Continue with regular exercise/ steps.

## 2020-01-20 ENCOUNTER — Ambulatory Visit: Payer: BC Managed Care – PPO | Admitting: Family Medicine

## 2020-01-28 ENCOUNTER — Telehealth: Payer: Self-pay

## 2020-01-28 NOTE — Telephone Encounter (Signed)
-----   Message from Gae Dry, MD sent at 01/28/2020  1:52 PM EDT ----- Regarding: MMG Received notice she has not received MMG yet as ordered at her Annual. Please check and encourage her to do this, and document conversation.

## 2020-01-28 NOTE — Telephone Encounter (Signed)
Pt had forgot all about her mammogram, aware now and will call to schedule

## 2020-02-08 ENCOUNTER — Ambulatory Visit
Admission: RE | Admit: 2020-02-08 | Discharge: 2020-02-08 | Disposition: A | Discharge status: Home / Self Care | Payer: BC Managed Care – PPO | Source: Ambulatory Visit | Attending: Obstetrics & Gynecology | Admitting: Obstetrics & Gynecology

## 2020-02-08 ENCOUNTER — Other Ambulatory Visit: Payer: Self-pay | Admitting: Obstetrics & Gynecology

## 2020-02-08 ENCOUNTER — Other Ambulatory Visit: Payer: Self-pay

## 2020-02-08 DIAGNOSIS — Z1231 Encounter for screening mammogram for malignant neoplasm of breast: Secondary | ICD-10-CM | POA: Insufficient documentation

## 2020-02-08 DIAGNOSIS — R921 Mammographic calcification found on diagnostic imaging of breast: Secondary | ICD-10-CM | POA: Diagnosis not present

## 2020-02-08 DIAGNOSIS — R928 Other abnormal and inconclusive findings on diagnostic imaging of breast: Secondary | ICD-10-CM

## 2020-02-08 DIAGNOSIS — Z1239 Encounter for other screening for malignant neoplasm of breast: Secondary | ICD-10-CM

## 2020-02-19 ENCOUNTER — Ambulatory Visit
Admission: RE | Admit: 2020-02-19 | Discharge: 2020-02-19 | Disposition: A | Discharge status: Home / Self Care | Payer: BC Managed Care – PPO | Source: Ambulatory Visit | Attending: Obstetrics & Gynecology | Admitting: Obstetrics & Gynecology

## 2020-02-19 DIAGNOSIS — R921 Mammographic calcification found on diagnostic imaging of breast: Secondary | ICD-10-CM | POA: Insufficient documentation

## 2020-02-19 DIAGNOSIS — R928 Other abnormal and inconclusive findings on diagnostic imaging of breast: Secondary | ICD-10-CM | POA: Diagnosis not present

## 2020-02-22 ENCOUNTER — Other Ambulatory Visit: Payer: Self-pay | Admitting: Obstetrics & Gynecology

## 2020-02-22 DIAGNOSIS — R921 Mammographic calcification found on diagnostic imaging of breast: Secondary | ICD-10-CM

## 2020-02-22 DIAGNOSIS — R928 Other abnormal and inconclusive findings on diagnostic imaging of breast: Secondary | ICD-10-CM

## 2020-02-22 NOTE — Progress Notes (Signed)
MMG results d/w pt Planning SCNB soon, to be arranged thru [facility] Questions answered  Barnett Applebaum, MD, [facility], [facility] 02/22/2020  7:44 AM

## 2020-02-24 ENCOUNTER — Other Ambulatory Visit: Payer: Self-pay | Admitting: Family Medicine

## 2020-02-24 DIAGNOSIS — I10 Essential (primary) hypertension: Secondary | ICD-10-CM

## 2020-02-24 MED ORDER — HYDROCHLOROTHIAZIDE 12.5 MG PO CAPS: 12.5000 mg | ORAL_CAPSULE | Freq: Every day | ORAL | 0 refills | Status: DC

## 2020-02-24 NOTE — Addendum Note (Signed)
Addended by: Valli Glance F on: 02/24/2020 10:19 AM   Modules accepted: Orders

## 2020-02-24 NOTE — Telephone Encounter (Signed)
Call to patient- sheduled follow up- she has not had RF recently as indicated on RF request.

## 2020-02-25 ENCOUNTER — Other Ambulatory Visit: Payer: Self-pay

## 2020-02-25 ENCOUNTER — Encounter: Payer: Self-pay | Admitting: Family Medicine

## 2020-02-25 ENCOUNTER — Ambulatory Visit (INDEPENDENT_AMBULATORY_CARE_PROVIDER_SITE_OTHER): Payer: BC Managed Care – PPO | Admitting: Family Medicine

## 2020-02-25 VITALS — BP 140/62 | HR 98 | Ht 66.0 in | Wt 187.0 lb

## 2020-02-25 DIAGNOSIS — I10 Essential (primary) hypertension: Secondary | ICD-10-CM

## 2020-02-25 MED ORDER — HYDROCHLOROTHIAZIDE 12.5 MG PO CAPS: 12.5000 mg | ORAL_CAPSULE | Freq: Every day | ORAL | 1 refills | Status: DC

## 2020-02-25 NOTE — Progress Notes (Signed)
Date:  02/25/2020   Name:  April Webb   DOB:  01/16/1961   MRN:  [id number]   Chief Complaint: Follow-up (A1C and foot exam tomorrow Dr.Oconnol) and Hypertension (medication follow up HCTZ)  Hypertension This is a chronic problem. The current episode started more than 1 year ago. The problem has been gradually improving since onset. The problem is controlled. Pertinent negatives include no anxiety, blurred vision, chest pain, headaches, malaise/fatigue, neck pain, orthopnea, palpitations, peripheral edema, PND, shortness of breath or sweats. There are no associated agents to hypertension. There are no known risk factors for coronary artery disease. Past treatments include diuretics. The current treatment provides moderate improvement. There are no compliance problems.  There is no history of angina, kidney disease, CAD/MI, CVA, heart failure, left ventricular hypertrophy, PVD or retinopathy. There is no history of chronic renal disease, a hypertension causing med or renovascular disease.    Lab Results  Component Value Date   CREATININE 0.66 07/20/2019   BUN 12 07/20/2019   NA 144 07/20/2019   K 4.7 07/20/2019   CL 102 07/20/2019   CO2 25 07/20/2019   Lab Results  Component Value Date   CHOL 169 07/20/2019   HDL 61 07/20/2019   LDLCALC 86 07/20/2019   TRIG 124 07/20/2019   CHOLHDL 2.8 10/30/2018   Lab Results  Component Value Date   TSH 1.180 04/24/2017   Lab Results  Component Value Date   HGBA1C 7.5 (H) 07/20/2019   No results found for: WBC, HGB, HCT, MCV, PLT No results found for: ALT, AST, GGT, ALKPHOS, BILITOT   Review of Systems  Constitutional: Negative.  Negative for chills, fatigue, fever, malaise/fatigue and unexpected weight change.  HENT: Negative for congestion, ear discharge, ear pain, rhinorrhea, sinus pressure, sneezing and sore throat.   Eyes: Negative for blurred vision, photophobia, pain, discharge, redness and itching.  Respiratory: Negative  for cough, shortness of breath, wheezing and stridor.   Cardiovascular: Negative for chest pain, palpitations, orthopnea and PND.  Gastrointestinal: Negative for abdominal pain, blood in stool, constipation, diarrhea, nausea and vomiting.  Endocrine: Negative for cold intolerance, heat intolerance, polydipsia, polyphagia and polyuria.  Genitourinary: Negative for dysuria, flank pain, frequency, hematuria, menstrual problem, pelvic pain, urgency, vaginal bleeding and vaginal discharge.  Musculoskeletal: Negative for arthralgias, back pain, myalgias and neck pain.  Skin: Negative for rash.  Allergic/Immunologic: Negative for environmental allergies and food allergies.  Neurological: Negative for dizziness, weakness, light-headedness, numbness and headaches.  Hematological: Negative for adenopathy. Does not bruise/bleed easily.  Psychiatric/Behavioral: Negative for dysphoric mood. The patient is not nervous/anxious.     Patient Active Problem List   Diagnosis Date Noted  . Vitamin D deficiency 08/04/2019  . Gastric polyp   . Problems with swallowing and mastication   . Family history of malignant neoplasm of gastrointestinal tract   . Rectal polyp   . Benign neoplasm of descending colon   . Benign neoplasm of ascending colon   . Type 2 diabetes mellitus without complication, without long-term current use of insulin ([place]) 12/11/2018  . Vaginal atrophy 04/24/2017    No Known Allergies  Past Surgical History:  Procedure Laterality Date  . ABDOMINAL HYSTERECTOMY    . BREAST BIOPSY Left 09/22/2009   us/ bx- neg  . BREAST CYST EXCISION Left    neg  . CESAREAN SECTION    . COLONOSCOPY WITH PROPOFOL N/A 01/19/2019   Procedure: COLONOSCOPY WITH BIOPSIES;  Surgeon: Lucilla Lame, MD;  Location: [place] [place]  [facility];  Service: Endoscopy;  Laterality: N/A;  . ESOPHAGEAL DILATION N/A 01/19/2019   Procedure: ESOPHAGEAL DILATION;  Surgeon: Lucilla Lame, MD;  Location: [place];  Service:  Endoscopy;  Laterality: N/A;  . ESOPHAGOGASTRODUODENOSCOPY (EGD) WITH PROPOFOL N/A 01/19/2019   Procedure: ESOPHAGOGASTRODUODENOSCOPY (EGD) WITH BIOPSIES and DILATION;  Surgeon: Lucilla Lame, MD;  Location: [place];  Service: Endoscopy;  Laterality: N/A;  Diabetic - oral meds  . LAPAROTOMY    . OOPHORECTOMY    . OVARIAN CYST REMOVAL    . POLYPECTOMY N/A 01/19/2019   Procedure: POLYPECTOMY;  Surgeon: Lucilla Lame, MD;  Location: [place];  Service: Endoscopy;  Laterality: N/A;    Social History   Tobacco Use  . Smoking status: Never Smoker  . Smokeless tobacco: Never Used  Substance Use Topics  . Alcohol use: Yes    Comment: occassionally - Holidays  . Drug use: No     Medication list has been reviewed and updated.  Current Meds  Medication Sig  . aspirin EC 81 MG tablet Take 81 mg by mouth daily.  . cholecalciferol (VITAMIN D3) 25 MCG (1000 UT) tablet Take 1,000 Units by mouth daily.  Marland Kitchen glipiZIDE (GLUCOTROL) 10 MG tablet Take 1 tablet (10 mg total) by mouth 2 (two) times daily before a meal.  . hydrochlorothiazide (MICROZIDE) 12.5 MG capsule Take 1 capsule (12.5 mg total) by mouth daily.  . metFORMIN (GLUCOPHAGE) 500 MG tablet Take 1 tablet (500 mg total) by mouth 2 (two) times daily with a meal.  . VITAMIN E PO Take by mouth daily.    PHQ 2/9 Scores 02/25/2020 12/31/2019 10/30/2018  PHQ - 2 Score 0 0 0  PHQ- 9 Score 0 - 0    BP Readings from Last 3 Encounters:  02/25/20 140/62  08/04/19 130/80  07/27/19 130/70    Physical Exam Vitals and nursing note reviewed.  Constitutional:      Appearance: She is well-developed.  HENT:     Head: Normocephalic.     Right Ear: Tympanic membrane, ear canal and external ear normal.     Left Ear: Tympanic membrane, ear canal and external ear normal.     Nose: Nose normal.  Eyes:     General: Lids are everted, no foreign bodies appreciated. No scleral icterus.       Left eye: No foreign body or hordeolum.      Conjunctiva/sclera: Conjunctivae normal.     Right eye: Right conjunctiva is not injected.     Left eye: Left conjunctiva is not injected.     Pupils: Pupils are equal, round, and reactive to light.  Neck:     Thyroid: No thyromegaly.     Vascular: No JVD.     Trachea: No tracheal deviation.  Cardiovascular:     Rate and Rhythm: Normal rate and regular rhythm.     Heart sounds: Normal heart sounds. No murmur. No friction rub. No gallop.   Pulmonary:     Effort: Pulmonary effort is normal. No respiratory distress.     Breath sounds: Normal breath sounds. No wheezing, rhonchi or rales.  Abdominal:     General: Bowel sounds are normal.     Palpations: Abdomen is soft. There is no mass.     Tenderness: There is no abdominal tenderness. There is no guarding or rebound.  Musculoskeletal:        General: No tenderness. Normal range of motion.     Cervical back: Normal range of motion and  neck supple.  Lymphadenopathy:     Cervical: No cervical adenopathy.  Skin:    General: Skin is warm.     Findings: No rash.  Neurological:     Mental Status: She is alert and oriented to person, place, and time.     Cranial Nerves: No cranial nerve deficit.     Deep Tendon Reflexes: Reflexes normal.  Psychiatric:        Mood and Affect: Mood is not anxious or depressed.     Wt Readings from Last 3 Encounters:  02/25/20 187 lb (84.8 kg)  12/31/19 184 lb 9.6 oz (83.7 kg)  08/04/19 186 lb (84.4 kg)    BP 140/62   Pulse 98   Ht 5\' 6"  (1.676 m)   Wt 187 lb (84.8 kg)   BMI 30.18 kg/m   Assessment and Plan:  1. Benign essential hypertension Chronic.  Uncontrolled but near.  Stable.  Continue hydrochlorothiazide 12.5 mg once a day.  I suggested that we use lisinopril given the fact that she has diabetes and that we will would benefit from some protection from diabetic nephropathy but patient is reluctant to add a medication of asked her to discuss this with Dr. Honor Junes particularly if her blood  pressure remains elevated at her diabetic clinic visit tomorrow.  Since patient has a chronic cough my suggestion would probably be starting on a low-dose losartan if this gets approved. - hydrochlorothiazide (MICROZIDE) 12.5 MG capsule; Take 1 capsule (12.5 mg total) by mouth daily.  Dispense: 90 capsule; Refill: 1

## 2020-02-26 ENCOUNTER — Ambulatory Visit
Admission: RE | Admit: 2020-02-26 | Discharge: 2020-02-26 | Disposition: A | Discharge status: Home / Self Care | Payer: BC Managed Care – PPO | Source: Ambulatory Visit | Attending: Obstetrics & Gynecology | Admitting: Obstetrics & Gynecology

## 2020-02-26 DIAGNOSIS — E1169 Type 2 diabetes mellitus with other specified complication: Secondary | ICD-10-CM | POA: Diagnosis not present

## 2020-02-26 DIAGNOSIS — R928 Other abnormal and inconclusive findings on diagnostic imaging of breast: Secondary | ICD-10-CM

## 2020-02-26 DIAGNOSIS — I10 Essential (primary) hypertension: Secondary | ICD-10-CM | POA: Diagnosis not present

## 2020-02-26 DIAGNOSIS — R921 Mammographic calcification found on diagnostic imaging of breast: Secondary | ICD-10-CM

## 2020-02-26 DIAGNOSIS — E1159 Type 2 diabetes mellitus with other circulatory complications: Secondary | ICD-10-CM | POA: Diagnosis not present

## 2020-02-26 DIAGNOSIS — E1165 Type 2 diabetes mellitus with hyperglycemia: Secondary | ICD-10-CM | POA: Diagnosis not present

## 2020-02-26 DIAGNOSIS — D242 Benign neoplasm of left breast: Secondary | ICD-10-CM | POA: Diagnosis not present

## 2020-02-29 LAB — SURGICAL PATHOLOGY

## 2020-05-12 ENCOUNTER — Telehealth: Payer: Self-pay

## 2020-05-12 NOTE — Telephone Encounter (Signed)
A company called [employer] is requesting labs on pt. Pt verified that she DOES want the labs from September 2020 sent to them. I faxed the labs only

## 2020-07-08 DIAGNOSIS — E1159 Type 2 diabetes mellitus with other circulatory complications: Secondary | ICD-10-CM | POA: Diagnosis not present

## 2020-07-08 DIAGNOSIS — E1169 Type 2 diabetes mellitus with other specified complication: Secondary | ICD-10-CM | POA: Diagnosis not present

## 2020-07-08 DIAGNOSIS — E1165 Type 2 diabetes mellitus with hyperglycemia: Secondary | ICD-10-CM | POA: Diagnosis not present

## 2020-07-08 DIAGNOSIS — I152 Hypertension secondary to endocrine disorders: Secondary | ICD-10-CM | POA: Diagnosis not present

## 2020-08-22 ENCOUNTER — Other Ambulatory Visit: Payer: Self-pay | Admitting: Family Medicine

## 2020-08-22 DIAGNOSIS — I10 Essential (primary) hypertension: Secondary | ICD-10-CM

## 2020-08-30 ENCOUNTER — Ambulatory Visit: Payer: BC Managed Care – PPO | Admitting: Family Medicine

## 2020-11-29 ENCOUNTER — Telehealth: Payer: Self-pay

## 2020-11-29 DIAGNOSIS — R5383 Other fatigue: Secondary | ICD-10-CM | POA: Diagnosis not present

## 2020-11-29 DIAGNOSIS — U071 COVID-19: Secondary | ICD-10-CM | POA: Diagnosis not present

## 2020-11-29 DIAGNOSIS — R059 Cough, unspecified: Secondary | ICD-10-CM | POA: Diagnosis not present

## 2020-11-29 DIAGNOSIS — E119 Type 2 diabetes mellitus without complications: Secondary | ICD-10-CM | POA: Diagnosis not present

## 2020-11-29 NOTE — Telephone Encounter (Signed)
Copied from [place] [id number]. Topic: General - Inquiry >> Nov 29, 2020 10:51 AM Scherrie Gerlach wrote: Reason for CRM: pt states she just tested positive for covid today.  She has cough, fever (101.7 yesterday)., runny nose, congestion. Pt would like the anti body infusion.  She called and they said her dr needs to refer her. Pt would like you to refer her.

## 2020-11-29 NOTE — Telephone Encounter (Signed)
Spoke to pt concerning her cough- she stated that a fast med doc prescribed her tessalon perles.

## 2020-12-02 DIAGNOSIS — U071 COVID-19: Secondary | ICD-10-CM | POA: Diagnosis not present

## 2020-12-02 DIAGNOSIS — E1165 Type 2 diabetes mellitus with hyperglycemia: Secondary | ICD-10-CM | POA: Diagnosis not present

## 2021-01-06 ENCOUNTER — Ambulatory Visit: Payer: Self-pay | Admitting: Family Medicine

## 2021-03-30 ENCOUNTER — Ambulatory Visit (INDEPENDENT_AMBULATORY_CARE_PROVIDER_SITE_OTHER): Payer: BC Managed Care – PPO | Admitting: Obstetrics & Gynecology

## 2021-03-30 ENCOUNTER — Other Ambulatory Visit: Payer: Self-pay

## 2021-03-30 ENCOUNTER — Encounter: Payer: Self-pay | Admitting: Obstetrics & Gynecology

## 2021-03-30 VITALS — BP 120/70 | Ht 66.0 in | Wt 183.0 lb

## 2021-03-30 DIAGNOSIS — Z01419 Encounter for gynecological examination (general) (routine) without abnormal findings: Secondary | ICD-10-CM

## 2021-03-30 DIAGNOSIS — Z1231 Encounter for screening mammogram for malignant neoplasm of breast: Secondary | ICD-10-CM | POA: Diagnosis not present

## 2021-03-30 NOTE — Patient Instructions (Signed)
Primary Care in the area  This is not meant to be comprehensive list, but a resource for some providers that you can call for counseling or medication needs. If you have a recommendation, please let us know.   [facility]:  [phone]               Dr Miguel Aschoff               Dr Juanetta Beets               Dr Lavon Paganini  [facility]:  [phone]  Dr Drue Stager [facility] [place]:   [phone]  Owens Loffler, MD  Waunita Schooner, MD  Ria Bush, MD  Jinny Sanders, MD  [facility], [place]: [phone]  Orland Mustard, MD   Dermatology Dr Evorn Gong Dr Nehemiah Massed

## 2021-03-30 NOTE — Progress Notes (Signed)
HPI:      [name] is a 60 y.o. [phone] who LMP was in the past, she presents today for her annual examination. Prior hysterectomy, also BSO.  The patient has no complaints today other than occasional med abd pains and bloating (intermittent). The patient is sexually active. Herlast pap: approximate date 2019 and was normal and last mammogram: approximate date 2021 and was abnormal: had biopsy and had follow up MMG and [place] normal.  The patient does perform self breast exams.  There is no notable family history of breast or ovarian cancer in her family. The patient is not taking hormone replacement therapy. Patient denies post-menopausal vaginal bleeding.   The patient has regular exercise: yes. The patient denies current symptoms of depression.    GYN Hx: Last Colonoscopy:2 years ago. Normal.  Last DEXA: never ago.    PMHx: Past Medical History:  Diagnosis Date  . Diabetes mellitus without complication ([place])   . GERD (gastroesophageal reflux disease)   . Goiter   . Heart murmur    mentioned at age 63. never noticed again.  Marland Kitchen Hypertension    Past Surgical History:  Procedure Laterality Date  . ABDOMINAL HYSTERECTOMY    . BREAST BIOPSY Left 09/22/2009   us/ bx- neg  . BREAST BIOPSY Left 02/26/2019   stereo bx, ribbon clip, pending path   . BREAST CYST EXCISION Left    neg  . CESAREAN SECTION    . COLONOSCOPY WITH PROPOFOL N/A 01/19/2019   Procedure: COLONOSCOPY WITH BIOPSIES;  Surgeon: Lucilla Lame, MD;  Location: [facility];  Service: Endoscopy;  Laterality: N/A;  . ESOPHAGEAL DILATION N/A 01/19/2019   Procedure: ESOPHAGEAL DILATION;  Surgeon: Lucilla Lame, MD;  Location: [place];  Service: Endoscopy;  Laterality: N/A;  . ESOPHAGOGASTRODUODENOSCOPY (EGD) WITH PROPOFOL N/A 01/19/2019   Procedure: ESOPHAGOGASTRODUODENOSCOPY (EGD) WITH BIOPSIES and DILATION;  Surgeon: Lucilla Lame, MD;  Location: [place];  Service: Endoscopy;  Laterality: N/A;   Diabetic - oral meds  . LAPAROTOMY    . OOPHORECTOMY    . OVARIAN CYST REMOVAL    . POLYPECTOMY N/A 01/19/2019   Procedure: POLYPECTOMY;  Surgeon: Lucilla Lame, MD;  Location: [facility];  Service: Endoscopy;  Laterality: N/A;   Family History  Problem Relation Age of Onset  . Colon cancer Mother   . Hypertension Father   . Prostate cancer Father   . Breast cancer Neg Hx    Social History   Tobacco Use  . Smoking status: Never Smoker  . Smokeless tobacco: Never Used  Vaping Use  . Vaping Use: Never used  Substance Use Topics  . Alcohol use: Yes    Comment: occassionally - Holidays  . Drug use: No    Current Outpatient Medications:  .  aspirin EC 81 MG tablet, Take 81 mg by mouth daily., Disp: , Rfl:  .  cholecalciferol (VITAMIN D3) 25 MCG (1000 UT) tablet, Take 1,000 Units by mouth daily., Disp: , Rfl:  .  glipiZIDE (GLUCOTROL) 10 MG tablet, Take 1 tablet (10 mg total) by mouth 2 (two) times daily before a meal., Disp: 180 tablet, Rfl: 1 .  hydrochlorothiazide (MICROZIDE) 12.5 MG capsule, Take 1 capsule by mouth once daily, Disp: 30 capsule, Rfl: 0 .  metFORMIN (GLUCOPHAGE) 500 MG tablet, Take 1 tablet (500 mg total) by mouth 2 (two) times daily with a meal., Disp: 180 tablet, Rfl: 1 .  VITAMIN E PO, Take by mouth daily., Disp: , Rfl:  Allergies: Patient has no known allergies.  Review of Systems  Constitutional: Negative for chills, fever and malaise/fatigue.  HENT: Negative for congestion, sinus pain and sore throat.   Eyes: Negative for blurred vision and pain.  Respiratory: Negative for cough and wheezing.   Cardiovascular: Negative for chest pain and leg swelling.  Gastrointestinal: Negative for abdominal pain, constipation, diarrhea, heartburn, nausea and vomiting.  Genitourinary: Negative for dysuria, frequency, hematuria and urgency.  Musculoskeletal: Negative for back pain, joint pain, myalgias and neck pain.  Skin: Negative for itching and rash.   Neurological: Negative for dizziness, tremors and weakness.  Endo/Heme/Allergies: Does not bruise/bleed easily.  Psychiatric/Behavioral: Negative for depression. The patient is not nervous/anxious and does not have insomnia.     Objective: BP 120/70   Ht 5\' 6"  (1.676 m)   Wt 183 lb (83 kg)   BMI 29.54 kg/m   Filed Weights   03/30/21 1006  Weight: 183 lb (83 kg)   Body mass index is 29.54 kg/m. Physical Exam Constitutional:      General: She is not in acute distress.    Appearance: She is well-developed.  Genitourinary:     Vulva, bladder and rectum normal.     No lesions in the vagina.     Genitourinary Comments: Vaginal cuff well healed     No vaginal bleeding.      Right Adnexa: not tender and no mass present.    Left Adnexa: not tender and no mass present.    Cervix is absent.     Uterus is absent.  Breasts:     Right: No mass, skin change or tenderness.     Left: No mass, skin change or tenderness.    HENT:     Head: Normocephalic and atraumatic. No laceration.     Right Ear: Hearing normal.     Left Ear: Hearing normal.     Mouth/Throat:     Pharynx: Uvula midline.  Eyes:     Pupils: Pupils are equal, round, and reactive to light.  Neck:     Thyroid: No thyromegaly.  Cardiovascular:     Rate and Rhythm: Normal rate and regular rhythm.     Heart sounds: No murmur heard. No friction rub. No gallop.   Pulmonary:     Effort: Pulmonary effort is normal. No respiratory distress.     Breath sounds: Normal breath sounds. No wheezing.  Abdominal:     General: Bowel sounds are normal. There is no distension.     Palpations: Abdomen is soft.     Tenderness: There is no abdominal tenderness. There is no rebound.  Musculoskeletal:        General: Normal range of motion.     Cervical back: Normal range of motion and neck supple.  Neurological:     Mental Status: She is alert and oriented to person, place, and time.     Cranial Nerves: No cranial nerve deficit.   Skin:    General: Skin is warm and dry.  Psychiatric:        Judgment: Judgment normal.  Vitals reviewed.     Assessment: Annual Exam 1. Women's annual routine gynecological examination   2. Encounter for screening mammogram for malignant neoplasm of breast     Plan:            1.  Vaginal Screening-  Pap smear schedule reviewed with patient  2. Breast screening- Exam annually and mammogram scheduled  3. Colonoscopy every 10 years, Hemoccult testing  after age 64  4. Labs managed by PCP  5. Counseling for hormonal therapy: none              6. FRAX - FRAX score for assessing the 10 year probability for fracture calculated and discussed today.  Based on age and score today, DEXA is not currently scheduled.   7. Monitor for abd pain, may be hernia related although no major sx's of bulge or hernia currently.  May be adhesion related.  Referral if worsens.    F/U  Return in about 1 year (around 03/30/2022) for Annual.  Barnett Applebaum, MD, [facility], [facility] 03/30/2021  10:47 AM

## 2021-04-17 DIAGNOSIS — E559 Vitamin D deficiency, unspecified: Secondary | ICD-10-CM | POA: Diagnosis not present

## 2021-04-17 DIAGNOSIS — E1165 Type 2 diabetes mellitus with hyperglycemia: Secondary | ICD-10-CM | POA: Diagnosis not present

## 2021-04-17 DIAGNOSIS — I152 Hypertension secondary to endocrine disorders: Secondary | ICD-10-CM | POA: Diagnosis not present

## 2021-04-17 DIAGNOSIS — E1169 Type 2 diabetes mellitus with other specified complication: Secondary | ICD-10-CM | POA: Diagnosis not present

## 2021-04-17 DIAGNOSIS — E1159 Type 2 diabetes mellitus with other circulatory complications: Secondary | ICD-10-CM | POA: Diagnosis not present

## 2021-04-17 DIAGNOSIS — E785 Hyperlipidemia, unspecified: Secondary | ICD-10-CM | POA: Diagnosis not present

## 2021-06-27 ENCOUNTER — Other Ambulatory Visit: Payer: Self-pay | Admitting: Obstetrics & Gynecology

## 2021-07-13 ENCOUNTER — Telehealth: Payer: Self-pay

## 2021-07-13 NOTE — Telephone Encounter (Signed)
Pt states she will call [facility] back today to try and schedule her mammo

## 2021-07-13 NOTE — Telephone Encounter (Signed)
-----   Message from Gae Dry, MD sent at 06/27/2021  7:38 AM EDT ----- Regarding: MMG Received notice she has not received MMG yet as ordered at her Annual. Please check and encourage her to do this, and document conversation.

## 2021-08-03 ENCOUNTER — Other Ambulatory Visit: Payer: Self-pay

## 2021-08-03 ENCOUNTER — Ambulatory Visit
Admission: RE | Admit: 2021-08-03 | Discharge: 2021-08-03 | Disposition: A | Discharge status: Home / Self Care | Payer: BC Managed Care – PPO | Source: Ambulatory Visit | Attending: Obstetrics & Gynecology | Admitting: Obstetrics & Gynecology

## 2021-08-03 DIAGNOSIS — Z1231 Encounter for screening mammogram for malignant neoplasm of breast: Secondary | ICD-10-CM | POA: Diagnosis not present

## 2021-11-23 ENCOUNTER — Other Ambulatory Visit: Payer: Self-pay

## 2021-11-23 ENCOUNTER — Ambulatory Visit
Admission: EM | Admit: 2021-11-23 | Discharge: 2021-11-23 | Disposition: A | Discharge status: Home / Self Care | Payer: BC Managed Care – PPO | Attending: Emergency Medicine | Admitting: Emergency Medicine

## 2021-11-23 DIAGNOSIS — J069 Acute upper respiratory infection, unspecified: Secondary | ICD-10-CM

## 2021-11-23 DIAGNOSIS — Z20822 Contact with and (suspected) exposure to covid-19: Secondary | ICD-10-CM | POA: Insufficient documentation

## 2021-11-23 MED ORDER — PROMETHAZINE-DM 6.25-15 MG/5ML PO SYRP: 5.0000 mL | ORAL_SOLUTION | Freq: Four times a day (QID) | ORAL | 0 refills | Status: DC | PRN

## 2021-11-23 MED ORDER — IPRATROPIUM BROMIDE 0.06 % NA SOLN: 2.0000 | Freq: Four times a day (QID) | NASAL | 12 refills | Status: DC

## 2021-11-23 MED ORDER — BENZONATATE 100 MG PO CAPS: 200.0000 mg | ORAL_CAPSULE | Freq: Three times a day (TID) | ORAL | 0 refills | Status: DC

## 2021-11-23 NOTE — ED Triage Notes (Signed)
Patient here with "Cough" now with "SOB" also, starting on Monday. No fever. A lot of runny nose.@ home COVID19 test on Tuesday "Negative". Exposure to person at job "but wasn't exposed recently".

## 2021-11-23 NOTE — ED Provider Notes (Signed)
[facility]    CSN: [id number] Arrival date & time: 11/23/21  1155      History   Chief Complaint Chief Complaint  Patient presents with   Cough    HPI April Webb is a 61 y.o. female.   HPI  61 year old female here for evaluation of respiratory complaints.  Patient reports that for the last 3 days she has been experiencing a scratchy throat, which has resolved, runny nose, clear nasal discharge, intermittently productive cough for clear sputum, and mild shortness of breath.  She denies any fever, ear pain, wheezing, or GI complaints.  Past Medical History:  Diagnosis Date   Diabetes mellitus without complication (H[facility])    GERD (gastroesophageal reflux disease)    Goiter    Heart murmur    mentioned at age 73. never noticed again.   Hypertension     Patient Active Problem List   Diagnosis Date Noted   Vitamin D deficiency 08/04/2019   Gastric polyp    Problems with swallowing and mastication    Family history of malignant neoplasm of gastrointestinal tract    Rectal polyp    Benign neoplasm of descending colon    Benign neoplasm of ascending colon    Type 2 diabetes mellitus without complication, without long-term current use of insulin ([place]) 12/11/2018   Vaginal atrophy 04/24/2017    Past Surgical History:  Procedure Laterality Date   ABDOMINAL HYSTERECTOMY     BREAST BIOPSY Left 09/22/2009   us/ bx- neg   BREAST BIOPSY Left 02/26/2019   stereo bx, ribbon clip, pending path    BREAST CYST EXCISION Left    neg   CESAREAN SECTION     COLONOSCOPY WITH PROPOFOL N/A 01/19/2019   Procedure: COLONOSCOPY WITH BIOPSIES;  Surgeon: Lucilla Lame, MD;  Location: [place];  Service: Endoscopy;  Laterality: N/A;   ESOPHAGEAL DILATION N/A 01/19/2019   Procedure: ESOPHAGEAL DILATION;  Surgeon: Lucilla Lame, MD;  Location: [place];  Service: Endoscopy;  Laterality: N/A;   ESOPHAGOGASTRODUODENOSCOPY (EGD) WITH PROPOFOL N/A 01/19/2019    Procedure: ESOPHAGOGASTRODUODENOSCOPY (EGD) WITH BIOPSIES and DILATION;  Surgeon: Lucilla Lame, MD;  Location: [place];  Service: Endoscopy;  Laterality: N/A;  Diabetic - oral meds   LAPAROTOMY     OOPHORECTOMY     OVARIAN CYST REMOVAL     POLYPECTOMY N/A 01/19/2019   Procedure: POLYPECTOMY;  Surgeon: Lucilla Lame, MD;  Location: [place];  Service: Endoscopy;  Laterality: N/A;    OB History     Gravida  3   Para  3   Term  3   Preterm      AB      Living  3      SAB      IAB      Ectopic      Multiple      Live Births               Home Medications    Prior to Admission medications   Medication Sig Start Date End Date Taking? Authorizing Provider  aspirin EC 81 MG tablet Take 81 mg by mouth daily.   Yes [provider]  cholecalciferol (VITAMIN D3) 25 MCG (1000 UT) tablet Take 1,000 Units by mouth daily.   Yes [provider]  glipiZIDE (GLUCOTROL) 10 MG tablet Take 1 tablet (10 mg total) by mouth 2 (two) times daily before a meal. 07/20/19  Yes Juline Patch, MD  hydrochlorothiazide (  MICROZIDE) 12.5 MG capsule Take 1 capsule by mouth once daily 08/22/20  Yes Juline Patch, MD  metFORMIN (GLUCOPHAGE) 500 MG tablet Take 1 tablet (500 mg total) by mouth 2 (two) times daily with a meal. 07/20/19  Yes Juline Patch, MD  UNABLE TO FIND Med Name: Tylenol severe   Yes [provider]  VITAMIN E PO Take by mouth daily.   Yes [provider]  benzonatate (TESSALON) 100 MG capsule Take 2 capsules (200 mg total) by mouth every 8 (eight) hours. 11/23/21   Margarette Canada, NP  ipratropium (ATROVENT) 0.06 % nasal spray Place 2 sprays into both nostrils 4 (four) times daily. 11/23/21   Margarette Canada, NP  promethazine-dextromethorphan (PROMETHAZINE-DM) 6.25-15 MG/5ML syrup Take 5 mLs by mouth 4 (four) times daily as needed. 11/23/21   Margarette Canada, NP    Family History Family History  Problem Relation Age of Onset    Colon cancer Mother    Hypertension Father    Prostate cancer Father    Breast cancer Neg Hx     Social History Social History   Tobacco Use   Smoking status: Never   Smokeless tobacco: Never  Vaping Use   Vaping Use: Never used  Substance Use Topics   Alcohol use: Not Currently    Comment: occassionally - Holidays   Drug use: No     Allergies   Patient has no known allergies.   Review of Systems Review of Systems  Constitutional:  Negative for activity change, appetite change and fever.  HENT:  Positive for congestion, rhinorrhea and sore throat. Negative for ear pain.   Respiratory:  Positive for cough and shortness of breath. Negative for wheezing.   Gastrointestinal:  Negative for diarrhea, nausea and vomiting.  Skin:  Negative for rash.  Hematological: Negative.   Psychiatric/Behavioral: Negative.      Physical Exam Triage Vital Signs ED Triage Vitals [11/23/21 1206]  Enc Vitals Group     BP (!) 154/71     Pulse Rate 78     Resp 20     Temp 98.3 F (36.8 C)     Temp Source Oral     SpO2 98 %     Weight 179 lb (81.2 kg)     Height 5\' 6"  (1.676 m)     Head Circumference      Peak Flow      Pain Score 0     Pain Loc      Pain Edu?      Excl. in [place]?    No data found.  Updated Vital Signs BP (!) 154/71 (BP Location: Left Arm)    Pulse 78    Temp 98.3 F (36.8 C) (Oral)    Resp 20    Ht 5\' 6"  (1.676 m)    Wt 179 lb (81.2 kg)    SpO2 98%    BMI 28.89 kg/m   Visual Acuity Right Eye Distance:   Left Eye Distance:   Bilateral Distance:    Right Eye Near:   Left Eye Near:    Bilateral Near:     Physical Exam Vitals and nursing note reviewed.  Constitutional:      General: She is not in acute distress.    Appearance: Normal appearance. She is not ill-appearing.  HENT:     Head: Normocephalic and atraumatic.     Right Ear: Tympanic membrane, ear canal and external ear normal. There is no impacted cerumen.  Left Ear: Tympanic membrane, ear  canal and external ear normal. There is no impacted cerumen.     Nose: Congestion and rhinorrhea present.     Mouth/Throat:     Mouth: Mucous membranes are moist.     Pharynx: Oropharynx is clear. Posterior oropharyngeal erythema present.  Cardiovascular:     Rate and Rhythm: Normal rate and regular rhythm.     Pulses: Normal pulses.     Heart sounds: Normal heart sounds. No murmur heard.   No friction rub. No gallop.  Pulmonary:     Effort: Pulmonary effort is normal.     Breath sounds: Normal breath sounds. No wheezing, rhonchi or rales.  Musculoskeletal:     Cervical back: Normal range of motion and neck supple.  Lymphadenopathy:     Cervical: No cervical adenopathy.  Skin:    General: Skin is warm and dry.     Capillary Refill: Capillary refill takes less than 2 seconds.     Findings: No erythema or rash.  Neurological:     General: No focal deficit present.     Mental Status: She is alert and oriented to person, place, and time.  Psychiatric:        Mood and Affect: Mood normal.        Behavior: Behavior normal.        Thought Content: Thought content normal.        Judgment: Judgment normal.     UC Treatments / Results  Labs (all labs ordered are listed, but only abnormal results are displayed) Labs Reviewed  SARS CORONAVIRUS 2 (TAT 6-24 HRS)    EKG   Radiology No results found.  Procedures Procedures (including critical care time)  Medications Ordered in UC Medications - No data to display  Initial Impression / Assessment and Plan / UC Course  I have reviewed the triage vital signs and the nursing notes.  Pertinent labs & imaging results that were available during my care of the patient were reviewed by me and considered in my medical decision making (see chart for details).  Is a nontoxic-appearing 61 year old female here for evaluation of respiratory complaints outlined HPI above.  Patient's physical exam reveals pearly gray tympanic membranes  bilaterally with a normal light reflex and clear external auditory canals.  Nasal mucosa is erythematous and mildly edematous with scant clear discharge in both nares.  Oropharyngeal exam reveals posterior oropharyngeal erythema and clear postnasal drip.  No cervical lymphadenopathy appreciated on exam.  Cardiopulmonary exam feels clung sounds in all fields.  Patient exam is consistent with a viral upper respiratory infection.  Will swab patient for COVID and discharged her home to isolate pending the results.  If she test positive I will treat her with molnupiravir.  In the interim I will control her symptoms with Atrovent nasal spray for the nasal congestion and postnasal drip, Tessalon Perles and Promethazine DM cough syrup for cough and congestion.  She may use over-the-counter Tylenol and ibuprofen as needed for fever and body aches.  Work note provided and ER return precautions provided.   Final Clinical Impressions(s) / UC Diagnoses   Final diagnoses:  Viral URI with cough     Discharge Instructions      Isolate at home pending the results of your COVID test.  If you test positive then you will have to quarantine for 5 days from the start of your symptoms.  After 5 days you can break quarantine if your symptoms have improved and you  have not had a fever for 24 hours without taking Tylenol or ibuprofen.  Use over-the-counter Tylenol and ibuprofen as needed for body aches and fever.  Use the Atrovent nasal spray, 2 squirts in each nostril every 6 hours, as needed for runny nose and postnasal drip.  Use the Tessalon Perles every 8 hours during the day.  Take them with a small sip of water.  They may give you some numbness to the base of your tongue or a metallic taste in your mouth, this is normal.  Use the Promethazine DM cough syrup at bedtime for cough and congestion.  It will make you drowsy so do not take it during the day.  If you test positive we will treat you with molnupiravir.    If you develop any increased shortness of breath-especially at rest, you are unable to speak in full sentences, or is a late sign your lips are turning blue you need to go the ER for evaluation.      ED Prescriptions     Medication Sig Dispense Auth. Provider   benzonatate (TESSALON) 100 MG capsule  (Status: Discontinued) Take 2 capsules (200 mg total) by mouth every 8 (eight) hours. 21 capsule Margarette Canada, NP   ipratropium (ATROVENT) 0.06 % nasal spray  (Status: Discontinued) Place 2 sprays into both nostrils 4 (four) times daily. 15 mL Margarette Canada, NP   promethazine-dextromethorphan (PROMETHAZINE-DM) 6.25-15 MG/5ML syrup  (Status: Discontinued) Take 5 mLs by mouth 4 (four) times daily as needed. 118 mL Margarette Canada, NP   benzonatate (TESSALON) 100 MG capsule Take 2 capsules (200 mg total) by mouth every 8 (eight) hours. 21 capsule Margarette Canada, NP   ipratropium (ATROVENT) 0.06 % nasal spray Place 2 sprays into both nostrils 4 (four) times daily. 15 mL Margarette Canada, NP   promethazine-dextromethorphan (PROMETHAZINE-DM) 6.25-15 MG/5ML syrup Take 5 mLs by mouth 4 (four) times daily as needed. 118 mL Margarette Canada, NP      PDMP not reviewed this encounter.   Margarette Canada, NP 11/23/21 1432

## 2021-11-23 NOTE — Discharge Instructions (Addendum)
Isolate at home pending the results of your COVID test.  If you test positive then you will have to quarantine for 5 days from the start of your symptoms.  After 5 days you can break quarantine if your symptoms have improved and you have not had a fever for 24 hours without taking Tylenol or ibuprofen.  Use over-the-counter Tylenol and ibuprofen as needed for body aches and fever.  Use the Atrovent nasal spray, 2 squirts in each nostril every 6 hours, as needed for runny nose and postnasal drip.  Use the Tessalon Perles every 8 hours during the day.  Take them with a small sip of water.  They may give you some numbness to the base of your tongue or a metallic taste in your mouth, this is normal.  Use the Promethazine DM cough syrup at bedtime for cough and congestion.  It will make you drowsy so do not take it during the day.  If you test positive we will treat you with molnupiravir.   If you develop any increased shortness of breath-especially at rest, you are unable to speak in full sentences, or is a late sign your lips are turning blue you need to go the ER for evaluation.

## 2021-11-24 LAB — SARS CORONAVIRUS 2 (TAT 6-24 HRS): SARS Coronavirus 2: NEGATIVE

## 2022-03-05 IMAGING — MG MM DIGITAL SCREENING BILAT W/ TOMO AND CAD
8 series · 8 of 24 positions shown · non-contrast
Comparison: Previous exam(s).

CLINICAL DATA: Screening.

EXAM:
DIGITAL SCREENING BILATERAL MAMMOGRAM WITH TOMOSYNTHESIS AND CAD
TECHNIQUE: Bilateral screening digital craniocaudal and mediolateral oblique
mammograms were obtained. Bilateral screening digital breast
tomosynthesis was performed. The images were evaluated with
computer-aided detection.

[R CC synth-2D]
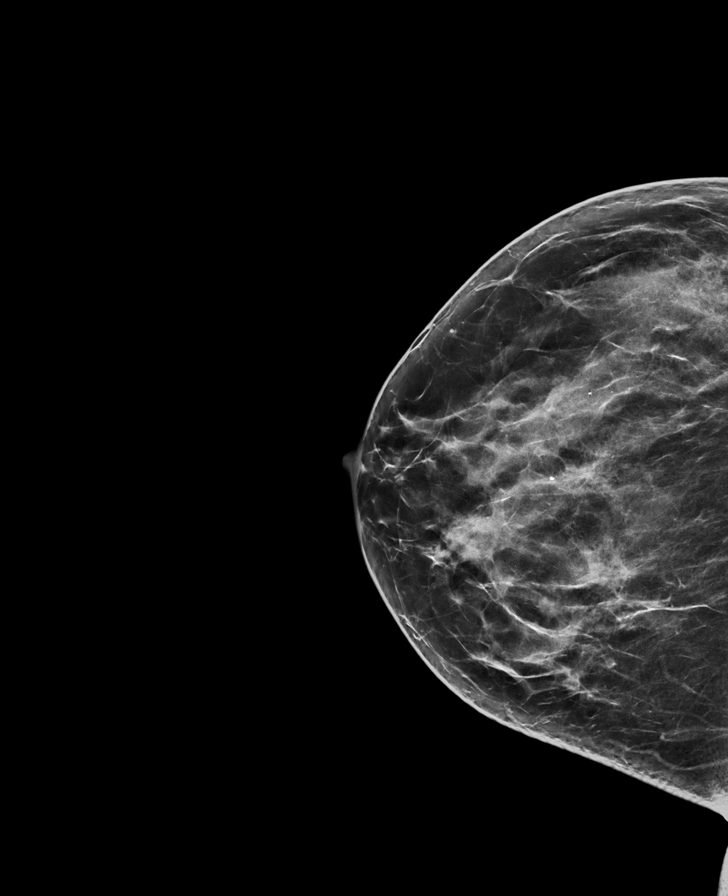

[L MLO synth-2D]
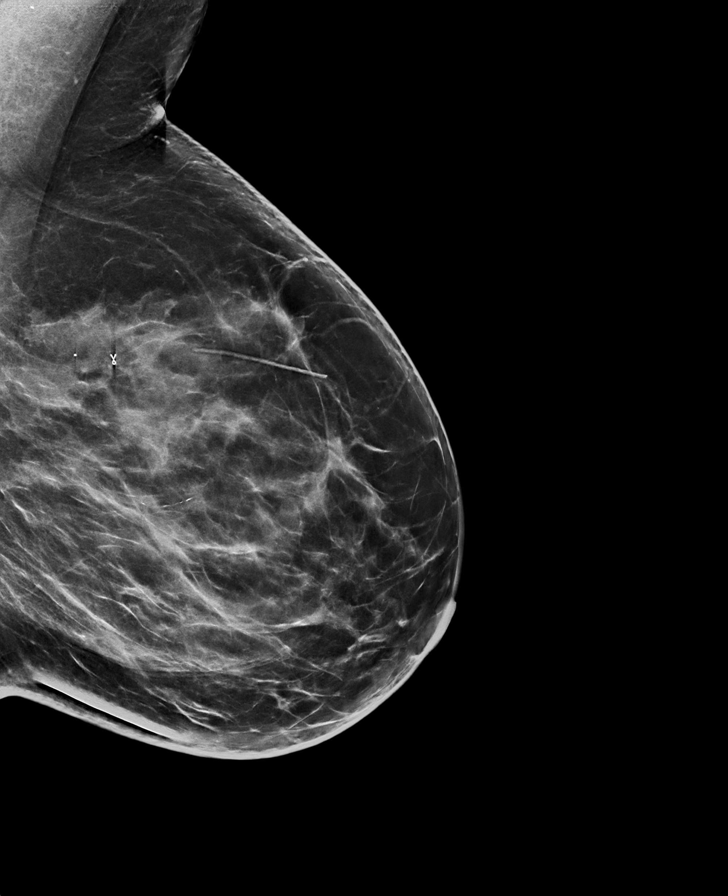

[R MLO synth-2D]
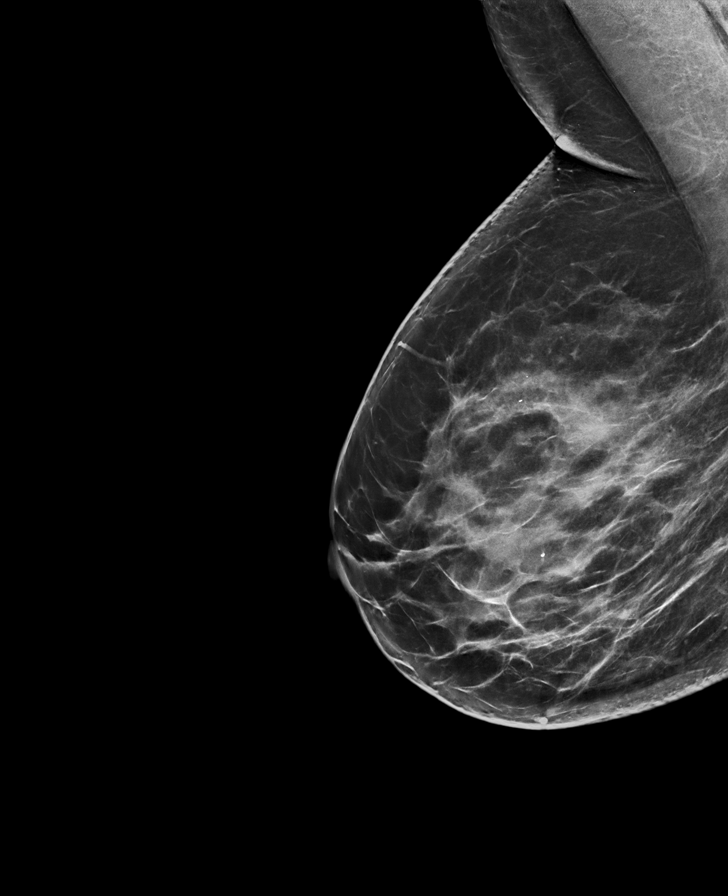

[L CC synth-2D]
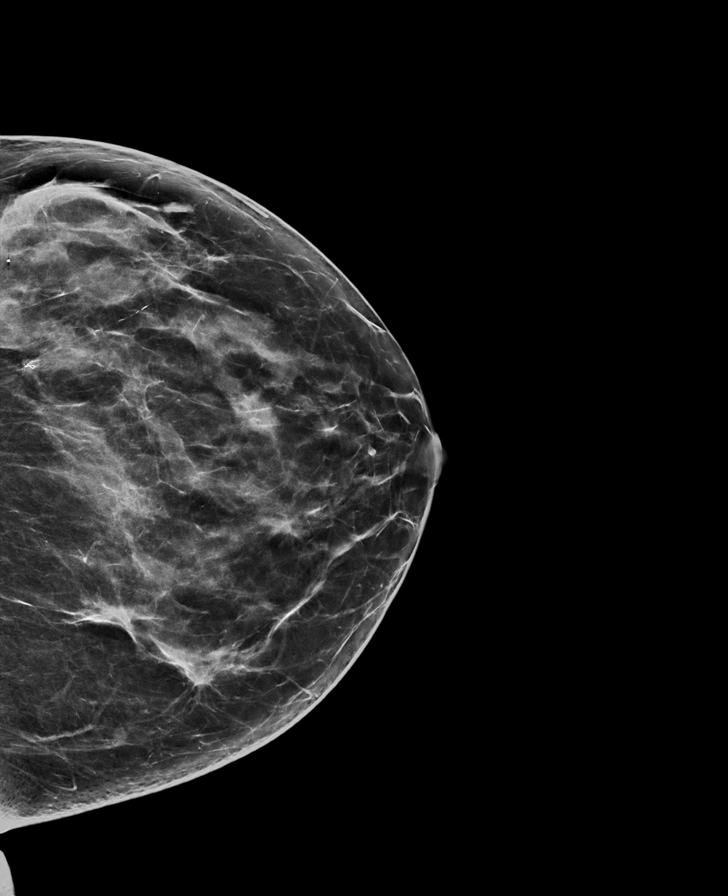

[R MLO tomo · tomo slice 40/79.0]
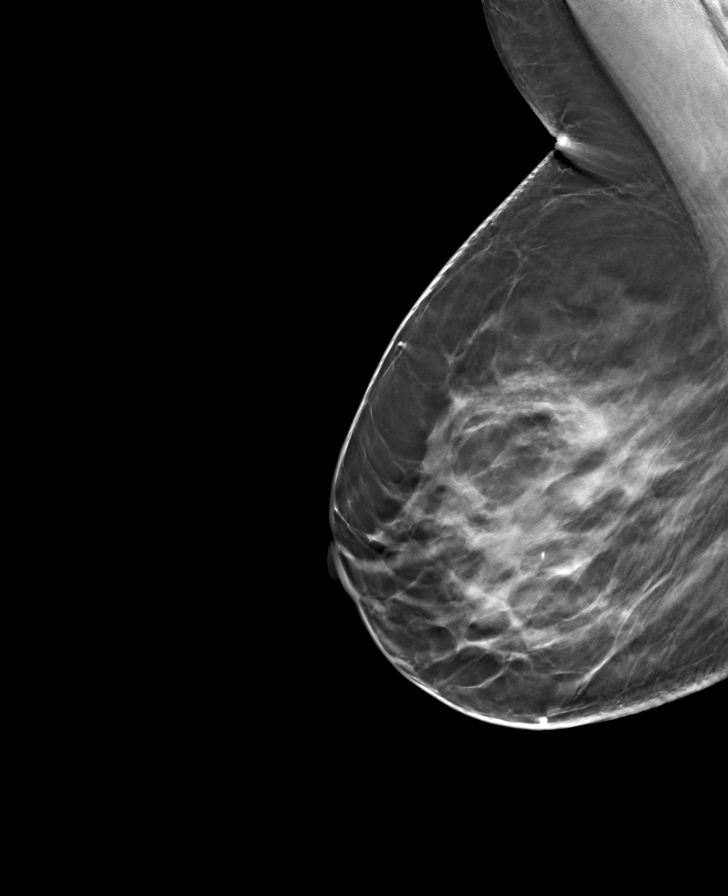

[L CC tomo · tomo slice 37/74.0]
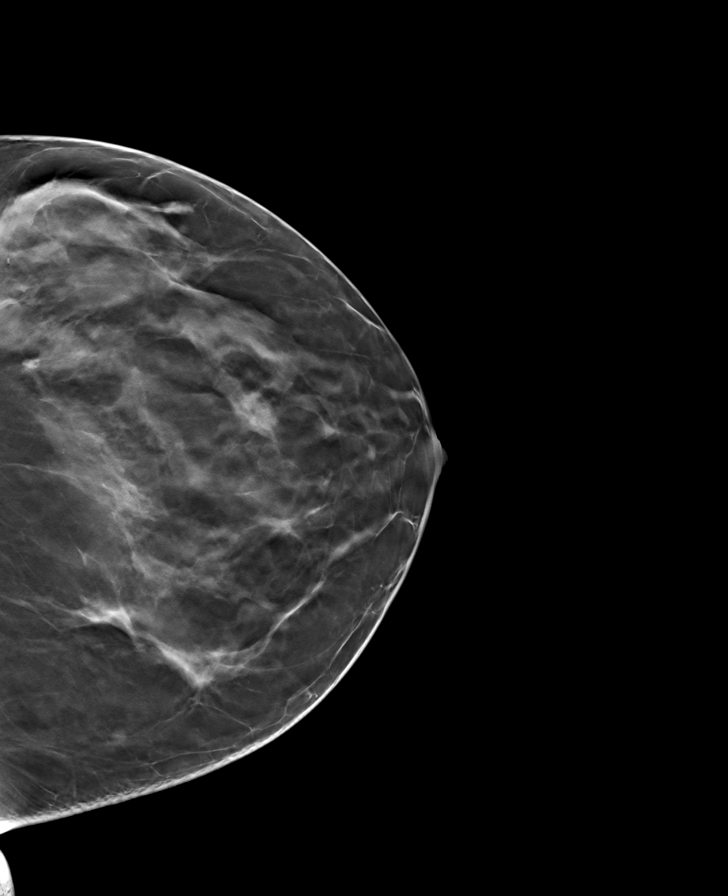

[L MLO tomo · tomo slice 43/86.0]
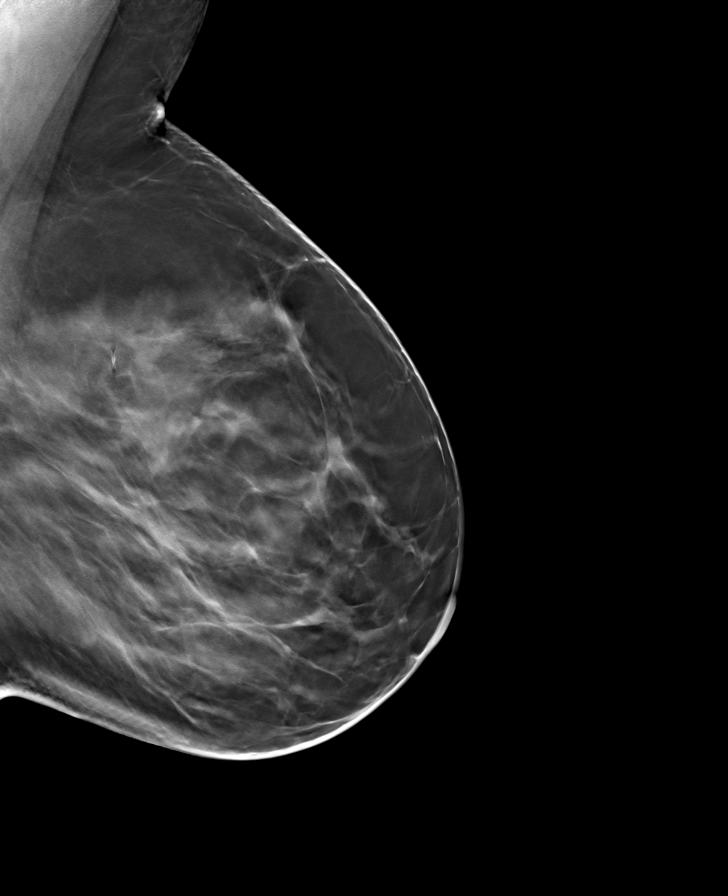

[R CC tomo · tomo slice 35/70.0]
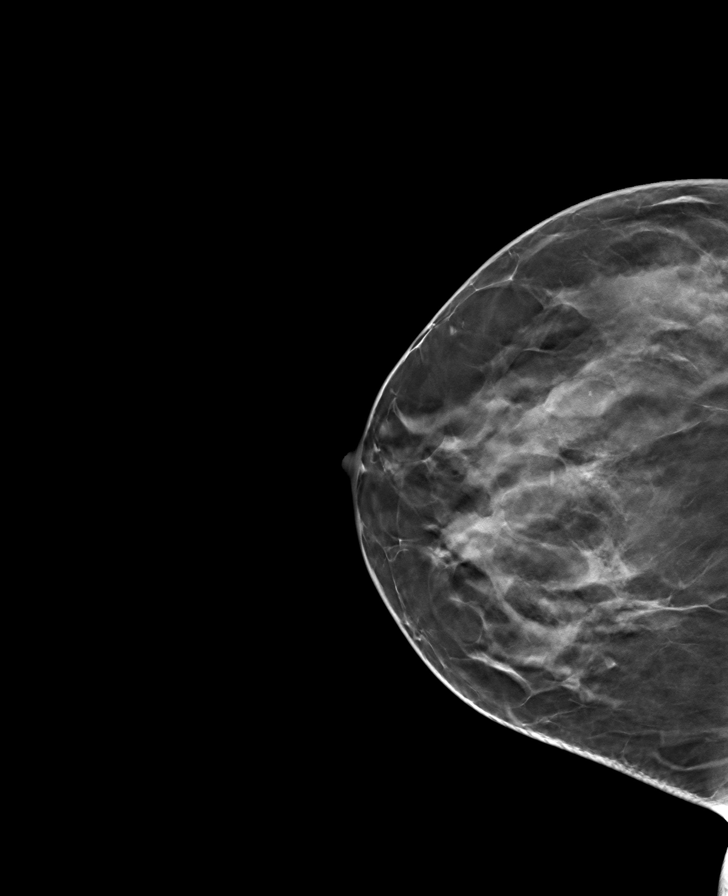

[8 of 24 positions shown; findings below may reference images not displayed]

ACR Breast Density Category c: The breast tissue is heterogeneously
dense, which may obscure small masses.
FINDINGS: There are no findings suspicious for malignancy.
IMPRESSION: No mammographic evidence of malignancy. A result letter of this
screening mammogram will be mailed directly to the patient.

RECOMMENDATION:
Screening mammogram in one year. (Code:Q3-W-BC3)

BI-RADS CATEGORY  1: Negative.

## 2022-07-05 ENCOUNTER — Other Ambulatory Visit: Payer: Self-pay | Admitting: Family Medicine

## 2022-07-20 DIAGNOSIS — E1169 Type 2 diabetes mellitus with other specified complication: Secondary | ICD-10-CM | POA: Diagnosis not present

## 2022-07-20 DIAGNOSIS — Z79899 Other long term (current) drug therapy: Secondary | ICD-10-CM | POA: Diagnosis not present

## 2022-07-20 DIAGNOSIS — E559 Vitamin D deficiency, unspecified: Secondary | ICD-10-CM | POA: Diagnosis not present

## 2022-07-20 DIAGNOSIS — E1165 Type 2 diabetes mellitus with hyperglycemia: Secondary | ICD-10-CM | POA: Diagnosis not present

## 2022-07-20 DIAGNOSIS — I152 Hypertension secondary to endocrine disorders: Secondary | ICD-10-CM | POA: Diagnosis not present

## 2022-07-20 DIAGNOSIS — E1159 Type 2 diabetes mellitus with other circulatory complications: Secondary | ICD-10-CM | POA: Diagnosis not present

## 2022-07-20 DIAGNOSIS — E785 Hyperlipidemia, unspecified: Secondary | ICD-10-CM | POA: Diagnosis not present

## 2022-07-29 NOTE — Progress Notes (Signed)
PCP: Juline Patch, MD   Chief Complaint  Patient presents with   Gynecologic Exam    Weight qs, breast pain every now and then on LB    HPI:      [name] is a 61 y.o. [id number] whose LMP was No LMP recorded. Patient has had a hysterectomy., presents today for her annual examination.  Her menses are absent due to hyst/BSO. No PMB. She does have occas vasomotor sx.   Sex activity: not sexually active due to pain, but would like to be. She does have vaginal dryness, hasn't tried lubricants.  Last Pap: 07/11/18  Results were: no abnormalities /neg HPV DNA.   Last mammogram: 08/03/21  Results were: normal--routine follow-up in 12 months There is no FH of breast cancer. There is no FH of ovarian cancer. The patient does do self-breast exams. Has occas LT breast outer quadrant tenderness, no masses. Has increased caffeine intake recently. Hx of LT benign breast bx in past.   Colonoscopy: 01/2019 with polyps with Dr.Wohl, Repeat due after 5 years. FH colon cancer in her mother.  Tobacco use: The patient denies current or previous tobacco use. Alcohol use: none No drug use Exercise: moderately active  She does get adequate calcium and Vitamin D in her diet.  Labs with PCP. Pt has noticed wt fluctuating a few # up and down. Hx of DM.    Patient Active Problem List   Diagnosis Date Noted   Vitamin D deficiency 08/04/2019   Gastric polyp    Problems with swallowing and mastication    Family history of malignant neoplasm of gastrointestinal tract    Rectal polyp    Benign neoplasm of descending colon    Benign neoplasm of ascending colon    Type 2 diabetes mellitus without complication, without long-term current use of insulin ([place]) 12/11/2018   Vaginal atrophy 04/24/2017    Past Surgical History:  Procedure Laterality Date   ABDOMINAL HYSTERECTOMY     BREAST BIOPSY Left 09/22/2009   us/ bx- neg   BREAST BIOPSY Left 02/26/2019   stereo bx, ribbon clip, pending path     BREAST CYST EXCISION Left    neg   CESAREAN SECTION     COLONOSCOPY WITH PROPOFOL N/A 01/19/2019   Procedure: COLONOSCOPY WITH BIOPSIES;  Surgeon: Lucilla Lame, MD;  Location: [place];  Service: Endoscopy;  Laterality: N/A;   ESOPHAGEAL DILATION N/A 01/19/2019   Procedure: ESOPHAGEAL DILATION;  Surgeon: Lucilla Lame, MD;  Location: [place];  Service: Endoscopy;  Laterality: N/A;   ESOPHAGOGASTRODUODENOSCOPY (EGD) WITH PROPOFOL N/A 01/19/2019   Procedure: ESOPHAGOGASTRODUODENOSCOPY (EGD) WITH BIOPSIES and DILATION;  Surgeon: Lucilla Lame, MD;  Location: [place];  Service: Endoscopy;  Laterality: N/A;  Diabetic - oral meds   LAPAROTOMY     OOPHORECTOMY     OVARIAN CYST REMOVAL     POLYPECTOMY N/A 01/19/2019   Procedure: POLYPECTOMY;  Surgeon: Lucilla Lame, MD;  Location: [place];  Service: Endoscopy;  Laterality: N/A;    Family History  Problem Relation Age of Onset   Colon cancer Mother    Hypertension Father    Prostate cancer Father    Breast cancer Neg Hx     Social History   Socioeconomic History   Marital status: Married    Spouse name: Not on file   Number of children: Not on file   Years of education: Not on file   Highest education level: Not on  file  Occupational History   Not on file  Tobacco Use   Smoking status: Never   Smokeless tobacco: Never  Vaping Use   Vaping Use: Never used  Substance and Sexual Activity   Alcohol use: Not Currently    Comment: occassionally - Holidays   Drug use: No   Sexual activity: Not Currently    Birth control/protection: Surgical    Comment: Hysterectomy  Other Topics Concern   Not on file  Social History Narrative   Not on file   Social Determinants of Health   Financial Resource Strain: Not on file  Food Insecurity: Not on file  Transportation Needs: Not on file  Physical Activity: Not on file  Stress: Not on file  Social Connections: Not on file  Intimate Partner  Violence: Not on file     Current Outpatient Medications:    aspirin EC 81 MG tablet, Take 81 mg by mouth daily., Disp: , Rfl:    cholecalciferol (VITAMIN D3) 25 MCG (1000 UT) tablet, Take 1,000 Units by mouth daily., Disp: , Rfl:    Cobalamin Combinations (B-12) [phone] MCG SUBL, , Disp: , Rfl:    glipiZIDE (GLUCOTROL) 5 MG tablet, Take 10 mg by mouth 2 (two) times daily., Disp: , Rfl:    hydrochlorothiazide (HYDRODIURIL) 25 MG tablet, Take 25 mg by mouth daily., Disp: , Rfl:    metFORMIN (GLUCOPHAGE) 500 MG tablet, Take 1 tablet (500 mg total) by mouth 2 (two) times daily with a meal., Disp: 180 tablet, Rfl: 1   VITAMIN E PO, Take by mouth daily., Disp: , Rfl:      ROS:  Review of Systems  Constitutional:  Negative for fatigue, fever and unexpected weight change.  Respiratory:  Negative for cough, shortness of breath and wheezing.   Cardiovascular:  Negative for chest pain, palpitations and leg swelling.  Gastrointestinal:  Negative for blood in stool, constipation, diarrhea, nausea and vomiting.  Endocrine: Negative for cold intolerance, heat intolerance and polyuria.  Genitourinary:  Negative for dyspareunia, dysuria, flank pain, frequency, genital sores, hematuria, menstrual problem, pelvic pain, urgency, vaginal bleeding, vaginal discharge and vaginal pain.  Musculoskeletal:  Negative for back pain, joint swelling and myalgias.  Skin:  Negative for rash.  Neurological:  Negative for dizziness, syncope, light-headedness, numbness and headaches.  Hematological:  Negative for adenopathy.  Psychiatric/Behavioral:  Negative for agitation, confusion, sleep disturbance and suicidal ideas. The patient is not nervous/anxious.    BREAST: tenderness    Objective: BP 126/70   Ht '5\' 6"'$  (1.676 m)   Wt 179 lb (81.2 kg)   BMI 28.89 kg/m    Physical Exam Constitutional:      Appearance: She is well-developed.  Genitourinary:     Vulva normal.     Right Labia: No rash, tenderness  or lesions.    Left Labia: No tenderness, lesions or rash.    No vaginal discharge, erythema or tenderness.      Right Adnexa: not tender and no mass present.    Left Adnexa: not tender and no mass present.    No cervical friability or polyp.     Uterus is not enlarged or tender.  Breasts:    Right: No mass, nipple discharge, skin change or tenderness.     Left: No mass, nipple discharge, skin change or tenderness.  Neck:     Thyroid: No thyromegaly.  Cardiovascular:     Rate and Rhythm: Normal rate and regular rhythm.     Heart sounds: Normal  heart sounds. No murmur heard. Pulmonary:     Effort: Pulmonary effort is normal.     Breath sounds: Normal breath sounds.  Abdominal:     Palpations: Abdomen is soft.     Tenderness: There is no abdominal tenderness. There is no guarding or rebound.  Musculoskeletal:        General: Normal range of motion.     Cervical back: Normal range of motion.  Lymphadenopathy:     Cervical: No cervical adenopathy.  Neurological:     General: No focal deficit present.     Mental Status: She is alert and oriented to person, place, and time.     Cranial Nerves: No cranial nerve deficit.  Skin:    General: Skin is warm and dry.  Psychiatric:        Mood and Affect: Mood normal.        Behavior: Behavior normal.        Thought Content: Thought content normal.        Judgment: Judgment normal.  Vitals reviewed.     Assessment/Plan:  Encounter for annual routine gynecological examination  Encounter for screening mammogram for malignant neoplasm of breast - Plan: MM 3D SCREEN BREAST BILATERAL; pt to schedule mammo  Breast tenderness--neg exam, no masses. D/C caffeine and see if sx improve. F/u prn  Weight gain--discussed low carb diet changes given hx of DM. Increase exercise.          GYN counsel breast self exam, mammography screening, menopause, adequate intake of calcium and vitamin D, diet and exercise    F/U  Return in about 1 year  (around 07/31/2023).  Yzabelle Calles B. Fatimata Talsma, PA-C 07/30/2022 10:46 AM

## 2022-07-30 ENCOUNTER — Ambulatory Visit (INDEPENDENT_AMBULATORY_CARE_PROVIDER_SITE_OTHER): Payer: BC Managed Care – PPO | Admitting: Obstetrics and Gynecology

## 2022-07-30 ENCOUNTER — Encounter: Payer: Self-pay | Admitting: Obstetrics and Gynecology

## 2022-07-30 VITALS — BP 126/70 | Ht 66.0 in | Wt 179.0 lb

## 2022-07-30 DIAGNOSIS — N644 Mastodynia: Secondary | ICD-10-CM

## 2022-07-30 DIAGNOSIS — R635 Abnormal weight gain: Secondary | ICD-10-CM | POA: Diagnosis not present

## 2022-07-30 DIAGNOSIS — Z01419 Encounter for gynecological examination (general) (routine) without abnormal findings: Secondary | ICD-10-CM

## 2022-07-30 DIAGNOSIS — Z1231 Encounter for screening mammogram for malignant neoplasm of breast: Secondary | ICD-10-CM

## 2022-07-30 NOTE — Patient Instructions (Addendum)
I value your feedback and you entrusting us with your care. If you get a [facility] patient survey, I would appreciate you taking the time to let us know about your experience today. Thank you!  [facility]: [phone]      

## 2022-08-09 DIAGNOSIS — I10 Essential (primary) hypertension: Secondary | ICD-10-CM | POA: Diagnosis not present

## 2022-08-09 DIAGNOSIS — E7800 Pure hypercholesterolemia, unspecified: Secondary | ICD-10-CM | POA: Diagnosis not present

## 2022-08-09 DIAGNOSIS — E1165 Type 2 diabetes mellitus with hyperglycemia: Secondary | ICD-10-CM | POA: Diagnosis not present

## 2022-08-09 DIAGNOSIS — Z23 Encounter for immunization: Secondary | ICD-10-CM | POA: Diagnosis not present

## 2022-08-09 DIAGNOSIS — E538 Deficiency of other specified B group vitamins: Secondary | ICD-10-CM | POA: Diagnosis not present

## 2022-08-16 ENCOUNTER — Ambulatory Visit
Admission: RE | Admit: 2022-08-16 | Discharge: 2022-08-16 | Disposition: A | Discharge status: Home / Self Care | Payer: BC Managed Care – PPO | Source: Ambulatory Visit | Attending: Obstetrics and Gynecology | Admitting: Obstetrics and Gynecology

## 2022-08-16 DIAGNOSIS — Z1231 Encounter for screening mammogram for malignant neoplasm of breast: Secondary | ICD-10-CM | POA: Diagnosis not present

## 2022-11-26 DIAGNOSIS — E559 Vitamin D deficiency, unspecified: Secondary | ICD-10-CM | POA: Diagnosis not present

## 2022-11-26 DIAGNOSIS — E1159 Type 2 diabetes mellitus with other circulatory complications: Secondary | ICD-10-CM | POA: Diagnosis not present

## 2022-11-26 DIAGNOSIS — E1169 Type 2 diabetes mellitus with other specified complication: Secondary | ICD-10-CM | POA: Diagnosis not present

## 2022-11-26 DIAGNOSIS — E1165 Type 2 diabetes mellitus with hyperglycemia: Secondary | ICD-10-CM | POA: Diagnosis not present

## 2023-03-22 DIAGNOSIS — I152 Hypertension secondary to endocrine disorders: Secondary | ICD-10-CM | POA: Diagnosis not present

## 2023-03-22 DIAGNOSIS — E1169 Type 2 diabetes mellitus with other specified complication: Secondary | ICD-10-CM | POA: Diagnosis not present

## 2023-03-22 DIAGNOSIS — E559 Vitamin D deficiency, unspecified: Secondary | ICD-10-CM | POA: Diagnosis not present

## 2023-03-22 DIAGNOSIS — E1159 Type 2 diabetes mellitus with other circulatory complications: Secondary | ICD-10-CM | POA: Diagnosis not present

## 2023-03-22 DIAGNOSIS — E538 Deficiency of other specified B group vitamins: Secondary | ICD-10-CM | POA: Diagnosis not present

## 2023-03-22 DIAGNOSIS — E1165 Type 2 diabetes mellitus with hyperglycemia: Secondary | ICD-10-CM | POA: Diagnosis not present

## 2023-03-22 DIAGNOSIS — E785 Hyperlipidemia, unspecified: Secondary | ICD-10-CM | POA: Diagnosis not present

## 2023-03-29 DIAGNOSIS — E1165 Type 2 diabetes mellitus with hyperglycemia: Secondary | ICD-10-CM | POA: Diagnosis not present

## 2023-03-29 DIAGNOSIS — I152 Hypertension secondary to endocrine disorders: Secondary | ICD-10-CM | POA: Diagnosis not present

## 2023-03-29 DIAGNOSIS — E1159 Type 2 diabetes mellitus with other circulatory complications: Secondary | ICD-10-CM | POA: Diagnosis not present

## 2023-03-29 DIAGNOSIS — E1169 Type 2 diabetes mellitus with other specified complication: Secondary | ICD-10-CM | POA: Diagnosis not present

## 2023-05-10 DIAGNOSIS — S76301A Unspecified injury of muscle, fascia and tendon of the posterior muscle group at thigh level, right thigh, initial encounter: Secondary | ICD-10-CM | POA: Diagnosis not present

## 2023-05-10 DIAGNOSIS — S76091A Other specified injury of muscle, fascia and tendon of right hip, initial encounter: Secondary | ICD-10-CM | POA: Diagnosis not present

## 2023-05-15 DIAGNOSIS — S76311A Strain of muscle, fascia and tendon of the posterior muscle group at thigh level, right thigh, initial encounter: Secondary | ICD-10-CM | POA: Diagnosis not present

## 2023-07-26 DIAGNOSIS — E1165 Type 2 diabetes mellitus with hyperglycemia: Secondary | ICD-10-CM | POA: Diagnosis not present

## 2023-07-26 DIAGNOSIS — I152 Hypertension secondary to endocrine disorders: Secondary | ICD-10-CM | POA: Diagnosis not present

## 2023-07-26 DIAGNOSIS — E1159 Type 2 diabetes mellitus with other circulatory complications: Secondary | ICD-10-CM | POA: Diagnosis not present

## 2023-08-21 NOTE — Progress Notes (Unsigned)
PCP: Duanne Limerick, MD   No chief complaint on file.   HPI:      [name] is a 62 y.o. [phone] whose LMP was No LMP recorded. Patient has had a hysterectomy., presents today for her annual examination.  Her menses are absent due to hyst/BSO. No PMB. She does have occas vasomotor sx.   Sex activity: not sexually active due to pain, but would like to be. She does have vaginal dryness, hasn't tried lubricants.  Last Pap: 07/11/18  Results were: no abnormalities /neg HPV DNA.   Last mammogram: 08/16/22 Results were: normal--routine follow-up in 12 months There is no FH of breast cancer. There is no FH of ovarian cancer. The patient does do self-breast exams. Has occas LT breast outer quadrant tenderness, no masses. Has increased caffeine intake recently. Hx of LT benign breast bx in past.   Colonoscopy: 01/2019 with polyps with Dr.Wohl, Repeat due after 5 years. FH colon cancer in her mother.  Tobacco use: The patient denies current or previous tobacco use. Alcohol use: none No drug use Exercise: moderately active  She does get adequate calcium and Vitamin D in her diet.  Labs with PCP. Pt has noticed wt fluctuating a few # up and down. Hx of DM.    Patient Active Problem List   Diagnosis Date Noted   Vitamin D deficiency 08/04/2019   Gastric polyp    Problems with swallowing and mastication    Family history of malignant neoplasm of gastrointestinal tract    Rectal polyp    Benign neoplasm of descending colon    Benign neoplasm of ascending colon    Type 2 diabetes mellitus without complication, without long-term current use of insulin ([facility]) 12/11/2018   Vaginal atrophy 04/24/2017    Past Surgical History:  Procedure Laterality Date   ABDOMINAL HYSTERECTOMY     BREAST BIOPSY Left 09/22/2009   us/ bx- neg   BREAST BIOPSY Left 02/26/2019   stereo bx, ribbon clip, pending path    BREAST CYST EXCISION Left    neg   CESAREAN SECTION     COLONOSCOPY WITH  PROPOFOL N/A 01/19/2019   Procedure: COLONOSCOPY WITH BIOPSIES;  Surgeon: Midge Minium, MD;  Location: [facility];  Service: Endoscopy;  Laterality: N/A;   ESOPHAGEAL DILATION N/A 01/19/2019   Procedure: ESOPHAGEAL DILATION;  Surgeon: Midge Minium, MD;  Location: [facility];  Service: Endoscopy;  Laterality: N/A;   ESOPHAGOGASTRODUODENOSCOPY (EGD) WITH PROPOFOL N/A 01/19/2019   Procedure: ESOPHAGOGASTRODUODENOSCOPY (EGD) WITH BIOPSIES and DILATION;  Surgeon: Midge Minium, MD;  Location: [facility];  Service: Endoscopy;  Laterality: N/A;  Diabetic - oral meds   LAPAROTOMY     OOPHORECTOMY     OVARIAN CYST REMOVAL     POLYPECTOMY N/A 01/19/2019   Procedure: POLYPECTOMY;  Surgeon: Midge Minium, MD;  Location: [facility];  Service: Endoscopy;  Laterality: N/A;    Family History  Problem Relation Age of Onset   Colon cancer Mother    Hypertension Father    Prostate cancer Father    Breast cancer Neg Hx     Social History   Socioeconomic History   Marital status: Married    Spouse name: Not on file   Number of children: Not on file   Years of education: Not on file   Highest education level: Not on file  Occupational History   Not on file  Tobacco Use   Smoking status: Never   Smokeless  tobacco: Never  Vaping Use   Vaping status: Never Used  Substance and Sexual Activity   Alcohol use: Not Currently    Comment: occassionally - Holidays   Drug use: No   Sexual activity: Not Currently    Birth control/protection: Surgical    Comment: Hysterectomy  Other Topics Concern   Not on file  Social History Narrative   Not on file   Social Determinants of Health   Financial Resource Strain: Low Risk  (08/09/2022)   [facility]   Overall Financial Resource Strain (CARDIA)    Difficulty of Paying Living Expenses: Not hard at all  Food Insecurity: No Food Insecurity (08/09/2022)   [facility]   Hunger Vital Sign    Worried About Running Out of Food in the Last Year: Never true    Ran Out of Food in the Last Year: Never true  Transportation Needs: Not on file  Physical Activity: Not on file  Stress: Not on file  Social Connections: Not on file  Intimate Partner Violence: Not on file     Current Outpatient Medications:    aspirin EC 81 MG tablet, Take 81 mg by mouth daily., Disp: , Rfl:    cholecalciferol (VITAMIN D3) 25 MCG (1000 UT) tablet, Take 1,000 Units by mouth daily., Disp: , Rfl:    Cobalamin Combinations (B-12) [phone] MCG SUBL, , Disp: , Rfl:    glipiZIDE (GLUCOTROL) 5 MG tablet, Take 10 mg by mouth 2 (two) times daily., Disp: , Rfl:    hydrochlorothiazide (HYDRODIURIL) 25 MG tablet, Take 25 mg by mouth daily., Disp: , Rfl:    metFORMIN (GLUCOPHAGE) 500 MG tablet, Take 1 tablet (500 mg total) by mouth 2 (two) times daily with a meal., Disp: 180 tablet, Rfl: 1   VITAMIN E PO, Take by mouth daily., Disp: , Rfl:      ROS:  Review of Systems  Constitutional:  Negative for fatigue, fever and unexpected weight change.  Respiratory:  Negative for cough, shortness of breath and wheezing.   Cardiovascular:  Negative for chest pain, palpitations and leg swelling.  Gastrointestinal:  Negative for blood in stool, constipation, diarrhea, nausea and vomiting.  Endocrine: Negative for cold intolerance, heat intolerance and polyuria.  Genitourinary:  Negative for dyspareunia, dysuria, flank pain, frequency, genital sores, hematuria, menstrual problem, pelvic pain, urgency, vaginal bleeding, vaginal discharge and vaginal pain.  Musculoskeletal:  Negative for back pain, joint swelling and myalgias.  Skin:  Negative for rash.  Neurological:  Negative for dizziness, syncope, light-headedness, numbness and headaches.  Hematological:  Negative for adenopathy.  Psychiatric/Behavioral:  Negative for agitation, confusion, sleep disturbance and suicidal ideas. The patient  is not nervous/anxious.    BREAST: tenderness    Objective: There were no vitals taken for this visit.   Physical Exam Constitutional:      Appearance: She is well-developed.  Genitourinary:     Vulva normal.     Right Labia: No rash, tenderness or lesions.    Left Labia: No tenderness, lesions or rash.    No vaginal discharge, erythema or tenderness.      Right Adnexa: not tender and no mass present.    Left Adnexa: not tender and no mass present.    No cervical friability or polyp.     Uterus is not enlarged or tender.  Breasts:    Right: No mass, nipple discharge, skin change or tenderness.  Left: No mass, nipple discharge, skin change or tenderness.  Neck:     Thyroid: No thyromegaly.  Cardiovascular:     Rate and Rhythm: Normal rate and regular rhythm.     Heart sounds: Normal heart sounds. No murmur heard. Pulmonary:     Effort: Pulmonary effort is normal.     Breath sounds: Normal breath sounds.  Abdominal:     Palpations: Abdomen is soft.     Tenderness: There is no abdominal tenderness. There is no guarding or rebound.  Musculoskeletal:        General: Normal range of motion.     Cervical back: Normal range of motion.  Lymphadenopathy:     Cervical: No cervical adenopathy.  Neurological:     General: No focal deficit present.     Mental Status: She is alert and oriented to person, place, and time.     Cranial Nerves: No cranial nerve deficit.  Skin:    General: Skin is warm and dry.  Psychiatric:        Mood and Affect: Mood normal.        Behavior: Behavior normal.        Thought Content: Thought content normal.        Judgment: Judgment normal.  Vitals reviewed.     Assessment/Plan:  Encounter for annual routine gynecological examination  Encounter for screening mammogram for malignant neoplasm of breast - Plan: MM 3D SCREEN BREAST BILATERAL; pt to schedule mammo  Breast tenderness--neg exam, no masses. D/C caffeine and see if sx improve.  F/u prn  Weight gain--discussed low carb diet changes given hx of DM. Increase exercise.          GYN counsel breast self exam, mammography screening, menopause, adequate intake of calcium and vitamin D, diet and exercise    F/U  No follow-ups on file.  Abdurrahman Petersheim B. Dorcus Riga, PA-C 08/21/2023 2:52 PM

## 2023-08-22 ENCOUNTER — Ambulatory Visit (INDEPENDENT_AMBULATORY_CARE_PROVIDER_SITE_OTHER): Payer: BC Managed Care – PPO | Admitting: Obstetrics and Gynecology

## 2023-08-22 ENCOUNTER — Encounter: Payer: Self-pay | Admitting: Obstetrics and Gynecology

## 2023-08-22 VITALS — BP 138/74 | Ht 66.0 in | Wt 177.0 lb

## 2023-08-22 DIAGNOSIS — Z1231 Encounter for screening mammogram for malignant neoplasm of breast: Secondary | ICD-10-CM

## 2023-08-22 DIAGNOSIS — Z01419 Encounter for gynecological examination (general) (routine) without abnormal findings: Secondary | ICD-10-CM | POA: Diagnosis not present

## 2023-08-22 DIAGNOSIS — Z1211 Encounter for screening for malignant neoplasm of colon: Secondary | ICD-10-CM

## 2023-08-22 DIAGNOSIS — N952 Postmenopausal atrophic vaginitis: Secondary | ICD-10-CM

## 2023-08-22 NOTE — Patient Instructions (Addendum)
I value your feedback and you entrusting [place] with your care. If you get a [facility] patient survey, I would appreciate you taking the time to let us know about your experience today. Thank you!  [facility] ([place])--[phone]

## 2023-08-26 DIAGNOSIS — E1169 Type 2 diabetes mellitus with other specified complication: Secondary | ICD-10-CM | POA: Diagnosis not present

## 2023-08-26 DIAGNOSIS — E1159 Type 2 diabetes mellitus with other circulatory complications: Secondary | ICD-10-CM | POA: Diagnosis not present

## 2023-08-26 DIAGNOSIS — I152 Hypertension secondary to endocrine disorders: Secondary | ICD-10-CM | POA: Diagnosis not present

## 2023-08-26 DIAGNOSIS — E1165 Type 2 diabetes mellitus with hyperglycemia: Secondary | ICD-10-CM | POA: Diagnosis not present

## 2023-08-27 ENCOUNTER — Telehealth: Payer: Self-pay

## 2023-08-27 NOTE — Telephone Encounter (Signed)
Patient left a voicemail to schedule colonoscopy. I called her back and I informed her we will need a referral to be sent in to schedule her. I gave her the fax number and she said that she will have them fax it over. She wants Dr. Servando Snare to do her procedure.

## 2023-08-28 ENCOUNTER — Telehealth: Payer: Self-pay | Admitting: Obstetrics and Gynecology

## 2023-08-28 DIAGNOSIS — Z1211 Encounter for screening for malignant neoplasm of colon: Secondary | ICD-10-CM

## 2023-08-28 NOTE — Telephone Encounter (Signed)
Pt aware.

## 2023-08-28 NOTE — Telephone Encounter (Signed)
Patient called stated that she needs a referral for colonoscopy with Dr. Servando Snare for March 2025 faxed over to [phone].

## 2023-08-28 NOTE — Telephone Encounter (Signed)
Ref sent, they will contact pt for appt.

## 2023-08-29 ENCOUNTER — Telehealth: Payer: Self-pay

## 2023-08-29 NOTE — Telephone Encounter (Signed)
Colonoscopy not due until 01/2024.  Last performed by Dr. Servando Snare.  Reminder sent to call patient in January to schedule.  Thanks,  [place], [place]

## 2023-09-11 ENCOUNTER — Ambulatory Visit
Admission: RE | Admit: 2023-09-11 | Discharge: 2023-09-11 | Disposition: A | Discharge status: Home / Self Care | Payer: BC Managed Care – PPO | Source: Ambulatory Visit | Attending: Obstetrics and Gynecology | Admitting: Obstetrics and Gynecology

## 2023-09-11 DIAGNOSIS — Z1231 Encounter for screening mammogram for malignant neoplasm of breast: Secondary | ICD-10-CM | POA: Insufficient documentation

## 2023-11-06 DIAGNOSIS — E119 Type 2 diabetes mellitus without complications: Secondary | ICD-10-CM | POA: Diagnosis not present

## 2023-11-07 ENCOUNTER — Ambulatory Visit (INDEPENDENT_AMBULATORY_CARE_PROVIDER_SITE_OTHER): Payer: BC Managed Care – PPO

## 2023-11-07 ENCOUNTER — Ambulatory Visit
Admission: EM | Admit: 2023-11-07 | Discharge: 2023-11-07 | Disposition: A | Discharge status: Home / Self Care | Payer: BC Managed Care – PPO

## 2023-11-07 DIAGNOSIS — R051 Acute cough: Secondary | ICD-10-CM | POA: Diagnosis not present

## 2023-11-07 DIAGNOSIS — R059 Cough, unspecified: Secondary | ICD-10-CM | POA: Diagnosis not present

## 2023-11-07 DIAGNOSIS — J069 Acute upper respiratory infection, unspecified: Secondary | ICD-10-CM | POA: Diagnosis not present

## 2023-11-07 MED ORDER — BENZONATATE 100 MG PO CAPS: 200.0000 mg | ORAL_CAPSULE | Freq: Three times a day (TID) | ORAL | 0 refills | Status: DC

## 2023-11-07 MED ORDER — AEROCHAMBER MV MISC: 2 refills | Status: DC

## 2023-11-07 MED ORDER — ALBUTEROL SULFATE HFA 108 (90 BASE) MCG/ACT IN AERS: 2.0000 | INHALATION_SPRAY | RESPIRATORY_TRACT | 0 refills | Status: AC | PRN

## 2023-11-07 MED ORDER — PROMETHAZINE-DM 6.25-15 MG/5ML PO SYRP: 5.0000 mL | ORAL_SOLUTION | Freq: Four times a day (QID) | ORAL | 0 refills | Status: DC | PRN

## 2023-11-07 MED ORDER — IPRATROPIUM BROMIDE 0.06 % NA SOLN: 2.0000 | Freq: Four times a day (QID) | NASAL | 12 refills | Status: DC

## 2023-11-07 NOTE — ED Provider Notes (Addendum)
 [facility]    CSN: [id number] Arrival date & time: 11/07/23  1841      History   Chief Complaint Chief Complaint  Patient presents with   Cough    HPI April Webb is a 63 y.o. female.   HPI  64 year old female with a past medical history significant for hypertension, heart murmur, goiter, GERD, and diabetes presents for evaluation of cough and a pins and needle stinging sensation in her chest for the past 4 days.  She has been using Tylenol  Cold and flu.  She does endorse some mild runny nose and nasal congestion and her husband is reporting some wheezing at nighttime.  She denies feeling short of breath or chest tightness.  Also denies ear pain or sore throat.  No known sick contacts or recent travel.  Past Medical History:  Diagnosis Date   Diabetes mellitus without complication (HCC)    GERD (gastroesophageal reflux disease)    Goiter    Heart murmur    mentioned at age 54. never noticed again.   Hypertension     Patient Active Problem List   Diagnosis Date Noted   Vitamin D  deficiency 08/04/2019   Gastric polyp    Problems with swallowing and mastication    Family history of malignant neoplasm of gastrointestinal tract    Rectal polyp    Benign neoplasm of descending colon    Benign neoplasm of ascending colon    Type 2 diabetes mellitus without complication, without long-term current use of insulin (HCC) 12/11/2018   Vaginal atrophy 04/24/2017    Past Surgical History:  Procedure Laterality Date   ABDOMINAL HYSTERECTOMY     BREAST BIOPSY Left 09/22/2009   us / bx- neg   BREAST BIOPSY Left 02/26/2019   stereo bx, ribbon clip, pending path    BREAST CYST EXCISION Left    neg   CESAREAN SECTION     COLONOSCOPY WITH PROPOFOL  N/A 01/19/2019   Procedure: COLONOSCOPY WITH BIOPSIES;  Surgeon: Jinny Carmine, MD;  Location: [facility];  Service: Endoscopy;  Laterality: N/A;   ESOPHAGEAL DILATION N/A 01/19/2019   Procedure: ESOPHAGEAL  DILATION;  Surgeon: Jinny Carmine, MD;  Location: [facility];  Service: Endoscopy;  Laterality: N/A;   ESOPHAGOGASTRODUODENOSCOPY (EGD) WITH PROPOFOL  N/A 01/19/2019   Procedure: ESOPHAGOGASTRODUODENOSCOPY (EGD) WITH BIOPSIES and DILATION;  Surgeon: Jinny Carmine, MD;  Location: [facility];  Service: Endoscopy;  Laterality: N/A;  Diabetic - oral meds   LAPAROTOMY     OOPHORECTOMY     OVARIAN CYST REMOVAL     POLYPECTOMY N/A 01/19/2019   Procedure: POLYPECTOMY;  Surgeon: Jinny Carmine, MD;  Location: [facility];  Service: Endoscopy;  Laterality: N/A;    OB History     Gravida  3   Para  3   Term  3   Preterm      AB      Living  3      SAB      IAB      Ectopic      Multiple      Live Births               Home Medications    Prior to Admission medications   Medication Sig Start Date End Date Taking? Authorizing Provider  albuterol  (VENTOLIN  HFA) 108 (90 Base) MCG/ACT inhaler Inhale 2 puffs into the lungs every 4 (four) hours as needed. 11/07/23  Yes Bernardino Ditch, NP  aspirin EC 81 MG  tablet Take 81 mg by mouth daily.   Yes [provider]  benzonatate  (TESSALON ) 100 MG capsule Take 2 capsules (200 mg total) by mouth every 8 (eight) hours. 11/07/23  Yes Bernardino Ditch, NP  cholecalciferol (VITAMIN D3) 25 MCG (1000 UT) tablet Take 1,000 Units by mouth daily.   Yes [provider]  Cobalamin Combinations (B-12) [phone] MCG SUBL  07/23/22  Yes [provider]  Continuous Glucose Sensor (FREESTYLE LIBRE 3 SENSOR) MISC USE 1 EACH EVERY 15 (FIFTEEN) DAYS 10/23/23  Yes [provider]  glipiZIDE  (GLUCOTROL ) 5 MG tablet Take 10 mg by mouth 2 (two) times daily. 06/11/22  Yes [provider]  hydrochlorothiazide  (HYDRODIURIL ) 25 MG tablet Take 25 mg by mouth daily. 07/09/22  Yes [provider]  ipratropium (ATROVENT ) 0.06 % nasal spray Place 2 sprays into both nostrils 4 (four) times daily. 11/07/23  Yes Bernardino Ditch, NP  metFORMIN  (GLUCOPHAGE ) 500 MG tablet Take 1 tablet (500 mg total) by mouth 2 (two) times daily with a meal. 07/20/19  Yes Joshua Cathryne BROCKS, MD  Multiple Vitamin (MULTI-VITAMIN) tablet Take 1 tablet by mouth daily.   Yes [provider]  promethazine -dextromethorphan (PROMETHAZINE -DM) 6.25-15 MG/5ML syrup Take 5 mLs by mouth 4 (four) times daily as needed. 11/07/23  Yes Bernardino Ditch, NP  Spacer/Aero-Holding Chambers (AEROCHAMBER MV) inhaler Use as instructed 11/07/23  Yes Bernardino Ditch, NP  VITAMIN E PO Take by mouth daily.   Yes [provider]    Family History Family History  Problem Relation Age of Onset   Colon cancer Mother 61   Hypertension Father    Prostate cancer Father    Breast cancer Neg Hx     Social History Social History   Tobacco Use   Smoking status: Never   Smokeless tobacco: Never  Vaping Use   Vaping status: Never Used  Substance Use Topics   Alcohol use: Not Currently    Comment: occassionally - Holidays   Drug use: No     Allergies   Patient has no known allergies.   Review of Systems Review of Systems  Constitutional:  Negative for fever.  HENT:  Positive for congestion and rhinorrhea. Negative for ear pain and sore throat.   Respiratory:  Positive for cough and wheezing. Negative for chest tightness and shortness of breath.      Physical Exam Triage Vital Signs ED Triage Vitals  Encounter Vitals Group     BP      Systolic BP Percentile      Diastolic BP Percentile      Pulse      Resp      Temp      Temp src      SpO2      Weight      Height      Head Circumference      Peak Flow      Pain Score      Pain Loc      Pain Education      Exclude from Growth Chart    No data found.  Updated Vital Signs BP 137/77 (BP Location: Left Arm)   Pulse 85   Temp 98.8 F (37.1 C) (Oral)   Resp 18   SpO2 95%   Visual Acuity Right Eye Distance:   Left Eye Distance:   Bilateral Distance:    Right Eye Near:    Left Eye Near:    Bilateral Near:     Physical  Exam Vitals and nursing note reviewed.  Constitutional:      Appearance: Normal appearance. She is not ill-appearing.  HENT:     Head: Normocephalic and atraumatic.     Right Ear: Tympanic membrane, ear canal and external ear normal. There is no impacted cerumen.     Left Ear: Tympanic membrane, ear canal and external ear normal. There is no impacted cerumen.     Nose: Congestion present. No rhinorrhea.     Comments: Nasal mucosa is mildly edematous without appreciable discharge.    Mouth/Throat:     Mouth: Mucous membranes are moist.     Pharynx: Oropharynx is clear. No oropharyngeal exudate or posterior oropharyngeal erythema.  Cardiovascular:     Rate and Rhythm: Normal rate and regular rhythm.     Pulses: Normal pulses.     Heart sounds: Normal heart sounds. No murmur heard.    No friction rub. No gallop.  Pulmonary:     Effort: Pulmonary effort is normal.     Breath sounds: Wheezing present. No rhonchi or rales.     Comments: Patient is generally decreased air movement with scattered wheezing.  No appreciable rales or rhonchi. Musculoskeletal:     Cervical back: Normal range of motion and neck supple. No tenderness.  Lymphadenopathy:     Cervical: No cervical adenopathy.  Skin:    General: Skin is warm and dry.     Capillary Refill: Capillary refill takes less than 2 seconds.     Findings: No rash.  Neurological:     General: No focal deficit present.     Mental Status: She is alert and oriented to person, place, and time.      UC Treatments / Results  Labs (all labs ordered are listed, but only abnormal results are displayed) Labs Reviewed - No data to display  EKG   Radiology No results found.  Procedures Procedures (including critical care time)  Medications Ordered in UC Medications - No data to display  Initial Impression / Assessment and Plan / UC Course  I have reviewed the triage vital signs and  the nursing notes.  Pertinent labs & imaging results that were available during my care of the patient were reviewed by me and considered in my medical decision making (see chart for details).   Patient is a pleasant, nontoxic-appearing 62 year old female presenting for evaluation of cough x 4 days as outlined HPI above.  She does endorse mild runny nose and nasal congestion but no ear pain or sore throat.  Her physical exam reveals mild inflammation of her nasal mucosa without appreciable rhinorrhea.  Oropharyngeal exam is also benign.  No cervical lymphadenopathy on exam.  Cardiopulmonary exam reveals decreased air movement diffusely with scattered expiratory wheezing.  The wheezing is more prominent when the patient coughs.  She is able to speak in full sentence without dyspnea or tachypnea.  Respiratory rate at triage is 18 with a 95% room air oxygen saturation.  I will obtain a chest x-ray to evaluate for any acute cardiopulmonary pathology.  Chest x-ray independently reviewed and evaluated by me.  Impression: Lung fields are well aerated without evidence of infiltrate or effusion.  Cardiomediastinal silhouette appears normal.  Radiology overread is pending. Radiology impression states no acute intrathoracic process.  I will discharge patient on the diagnosis of viral URI with a cough.   Final Clinical Impressions(s) / UC Diagnoses   Final diagnoses:  Acute cough  Viral URI with cough     Discharge Instructions  Your chest x-ray did not demonstrate any evidence of pneumonia.  Your exam is consistent with a viral respiratory infection.  I am and prescribe you albuterol  that she can use as needed for shortness of breath or wheezing.  Use with a spacer.  You can of 1 to 2 puffs every 4-6 hours as needed.  Use the Atrovent  nasal spray, 2 squirts in each nostril every 6 hours, as needed for runny nose and postnasal drip.  Use the Tessalon  Perles every 8 hours during the day.  Take  them with a small sip of water .  They may give you some numbness to the base of your tongue or a metallic taste in your mouth, this is normal.  Use the Promethazine  DM cough syrup at bedtime for cough and congestion.  It will make you drowsy so do not take it during the day.  Return for reevaluation or see your primary care provider for any new or worsening symptoms.      ED Prescriptions     Medication Sig Dispense Auth. Provider   Spacer/Aero-Holding Chambers (AEROCHAMBER MV) inhaler Use as instructed 1 each Bernardino Ditch, NP   albuterol  (VENTOLIN  HFA) 108 (90 Base) MCG/ACT inhaler Inhale 2 puffs into the lungs every 4 (four) hours as needed. 18 g Bernardino Ditch, NP   benzonatate  (TESSALON ) 100 MG capsule Take 2 capsules (200 mg total) by mouth every 8 (eight) hours. 21 capsule Bernardino Ditch, NP   ipratropium (ATROVENT ) 0.06 % nasal spray Place 2 sprays into both nostrils 4 (four) times daily. 15 mL Bernardino Ditch, NP   promethazine -dextromethorphan (PROMETHAZINE -DM) 6.25-15 MG/5ML syrup Take 5 mLs by mouth 4 (four) times daily as needed. 118 mL Bernardino Ditch, NP      PDMP not reviewed this encounter.   Bernardino Ditch, NP 11/07/23 2013    Bernardino Ditch, NP 11/11/23 [phone]

## 2023-11-07 NOTE — Discharge Instructions (Addendum)
 Your chest x-ray did not demonstrate any evidence of pneumonia.  Your exam is consistent with a viral respiratory infection.  I am and prescribe you albuterol  that she can use as needed for shortness of breath or wheezing.  Use with a spacer.  You can of 1 to 2 puffs every 4-6 hours as needed.  Use the Atrovent  nasal spray, 2 squirts in each nostril every 6 hours, as needed for runny nose and postnasal drip.  Use the Tessalon  Perles every 8 hours during the day.  Take them with a small sip of water .  They may give you some numbness to the base of your tongue or a metallic taste in your mouth, this is normal.  Use the Promethazine  DM cough syrup at bedtime for cough and congestion.  It will make you drowsy so do not take it during the day.  Return for reevaluation or see your primary care provider for any new or worsening symptoms.

## 2023-11-07 NOTE — ED Triage Notes (Signed)
 Cough, pt states it feels like a pin and needle stinging in her chest, x 4 days. Taking tylenol cold and flu.

## 2023-11-08 ENCOUNTER — Other Ambulatory Visit: Payer: Self-pay

## 2023-11-08 ENCOUNTER — Telehealth: Payer: Self-pay

## 2023-11-08 DIAGNOSIS — Z860100 Personal history of colon polyps, unspecified: Secondary | ICD-10-CM

## 2023-11-08 MED ORDER — NA SULFATE-K SULFATE-MG SULF 17.5-3.13-1.6 GM/177ML PO SOLN: 1.0000 | Freq: Once | ORAL | 0 refills | Status: AC

## 2023-11-08 NOTE — Telephone Encounter (Signed)
 Instructions corrected to note proper diabetic medications and stop dates.

## 2023-11-08 NOTE — Telephone Encounter (Signed)
 Gastroenterology Pre-Procedure Review  Request Date: 01/23/24 Requesting Physician: Dr. Jinny  PATIENT REVIEW QUESTIONS: The patient responded to the following health history questions as indicated:    1. Are you having any GI issues? no 2. Do you have a personal history of Polyps? yes (last colonoscopy performed by Dr. Jinny 01/19/19 recommended repeat in 5 years) 3. Do you have a family history of Colon Cancer or Polyps? yes (mother colon cancer) 4. Diabetes Mellitus? yes (has been advised to stop metformin  (2) days prior to colonoscopy, stop glipizide  (1) day prior to colonoscopy.  She also has a Libre implant for blood sugar monitoring has been advised it does not need to be removed) 5. Joint replacements in the past 12 months?no 6. Major health problems in the past 3 months?no 7. Any artificial heart valves, MVP, or defibrillator?no    MEDICATIONS & ALLERGIES:    Patient reports the following regarding taking any anticoagulation/antiplatelet therapy:   Plavix, Coumadin, Eliquis, Xarelto, Lovenox, Pradaxa, Brilinta, or Effient? no Aspirin? yes (81 mg daily)  Patient confirms/reports the following medications:  Current Outpatient Medications  Medication Sig Dispense Refill   albuterol  (VENTOLIN  HFA) 108 (90 Base) MCG/ACT inhaler Inhale 2 puffs into the lungs every 4 (four) hours as needed. 18 g 0   aspirin EC 81 MG tablet Take 81 mg by mouth daily.     benzonatate  (TESSALON ) 100 MG capsule Take 2 capsules (200 mg total) by mouth every 8 (eight) hours. 21 capsule 0   cholecalciferol (VITAMIN D3) 25 MCG (1000 UT) tablet Take 1,000 Units by mouth daily.     Cobalamin Combinations (B-12) [phone] MCG SUBL      Continuous Glucose Sensor (FREESTYLE LIBRE 3 SENSOR) MISC USE 1 EACH EVERY 15 (FIFTEEN) DAYS     glipiZIDE  (GLUCOTROL ) 5 MG tablet Take 10 mg by mouth 2 (two) times daily.     hydrochlorothiazide  (HYDRODIURIL ) 25 MG tablet Take 25 mg by mouth daily.     ipratropium (ATROVENT ) 0.06 %  nasal spray Place 2 sprays into both nostrils 4 (four) times daily. 15 mL 12   metFORMIN  (GLUCOPHAGE ) 500 MG tablet Take 1 tablet (500 mg total) by mouth 2 (two) times daily with a meal. 180 tablet 1   Multiple Vitamin (MULTI-VITAMIN) tablet Take 1 tablet by mouth daily.     promethazine -dextromethorphan (PROMETHAZINE -DM) 6.25-15 MG/5ML syrup Take 5 mLs by mouth 4 (four) times daily as needed. 118 mL 0   Spacer/Aero-Holding Chambers (AEROCHAMBER MV) inhaler Use as instructed 1 each 2   VITAMIN E PO Take by mouth daily.     No current facility-administered medications for this visit.    Patient confirms/reports the following allergies:  No Known Allergies  No orders of the defined types were placed in this encounter.   AUTHORIZATION INFORMATION Primary Insurance: 1D#: Group #:  Secondary Insurance: 1D#: Group #:  SCHEDULE INFORMATION: Date:  Time: Location:

## 2023-11-21 DIAGNOSIS — Z8619 Personal history of other infectious and parasitic diseases: Secondary | ICD-10-CM | POA: Diagnosis not present

## 2023-11-21 DIAGNOSIS — K5900 Constipation, unspecified: Secondary | ICD-10-CM | POA: Diagnosis not present

## 2023-11-21 DIAGNOSIS — R109 Unspecified abdominal pain: Secondary | ICD-10-CM | POA: Diagnosis not present

## 2023-11-26 DIAGNOSIS — R109 Unspecified abdominal pain: Secondary | ICD-10-CM | POA: Diagnosis not present

## 2023-11-26 DIAGNOSIS — Z8619 Personal history of other infectious and parasitic diseases: Secondary | ICD-10-CM | POA: Diagnosis not present

## 2023-11-29 DIAGNOSIS — R7989 Other specified abnormal findings of blood chemistry: Secondary | ICD-10-CM | POA: Diagnosis not present

## 2023-11-29 DIAGNOSIS — R109 Unspecified abdominal pain: Secondary | ICD-10-CM | POA: Diagnosis not present

## 2023-12-05 DIAGNOSIS — Z1329 Encounter for screening for other suspected endocrine disorder: Secondary | ICD-10-CM | POA: Diagnosis not present

## 2023-12-05 DIAGNOSIS — R7989 Other specified abnormal findings of blood chemistry: Secondary | ICD-10-CM | POA: Diagnosis not present

## 2023-12-07 DIAGNOSIS — E119 Type 2 diabetes mellitus without complications: Secondary | ICD-10-CM | POA: Diagnosis not present

## 2023-12-18 DIAGNOSIS — R1084 Generalized abdominal pain: Secondary | ICD-10-CM | POA: Diagnosis not present

## 2023-12-18 DIAGNOSIS — R109 Unspecified abdominal pain: Secondary | ICD-10-CM | POA: Diagnosis not present

## 2023-12-18 DIAGNOSIS — K838 Other specified diseases of biliary tract: Secondary | ICD-10-CM | POA: Diagnosis not present

## 2023-12-18 DIAGNOSIS — K828 Other specified diseases of gallbladder: Secondary | ICD-10-CM | POA: Diagnosis not present

## 2023-12-18 DIAGNOSIS — K760 Fatty (change of) liver, not elsewhere classified: Secondary | ICD-10-CM | POA: Diagnosis not present

## 2023-12-22 ENCOUNTER — Ambulatory Visit
Admission: EM | Admit: 2023-12-22 | Discharge: 2023-12-22 | Disposition: A | Discharge status: Home / Self Care | Payer: BC Managed Care – PPO | Attending: Emergency Medicine | Admitting: Emergency Medicine

## 2023-12-22 ENCOUNTER — Encounter: Payer: Self-pay | Admitting: Emergency Medicine

## 2023-12-22 DIAGNOSIS — J101 Influenza due to other identified influenza virus with other respiratory manifestations: Secondary | ICD-10-CM | POA: Insufficient documentation

## 2023-12-22 LAB — RESP PANEL BY RT-PCR (FLU A&B, COVID) ARPGX2
Influenza A by PCR: POSITIVE — AB
Influenza B by PCR: NEGATIVE
SARS Coronavirus 2 by RT PCR: NEGATIVE

## 2023-12-22 MED ORDER — OSELTAMIVIR PHOSPHATE 75 MG PO CAPS: 75.0000 mg | ORAL_CAPSULE | Freq: Two times a day (BID) | ORAL | 0 refills | Status: DC

## 2023-12-22 MED ORDER — HYDROCOD POLI-CHLORPHE POLI ER 10-8 MG/5ML PO SUER: 5.0000 mL | Freq: Two times a day (BID) | ORAL | 0 refills | Status: DC | PRN

## 2023-12-22 MED ORDER — ACETAMINOPHEN 325 MG PO TABS
975.0000 mg | ORAL_TABLET | Freq: Once | ORAL | Status: AC
  Administered 2023-12-22: 975 mg via ORAL

## 2023-12-22 NOTE — ED Triage Notes (Signed)
Patient reports cough and chest congestion and chills that started yesterday.  Patient unsure of fevers.

## 2023-12-22 NOTE — ED Provider Notes (Signed)
HPI  SUBJECTIVE:  April Webb is a 63 y.o. female who presents with occasionally productive cough, headache, generalized weakness, rhinorrhea, mild sore throat, chest soreness from coughing starting yesterday.  She has a fever here of 101.  No fevers at home.  No body aches, nasal congestion, postnasal drip, wheezing or shortness of breath, nausea,, diarrhea, abdominal pain.  No known COVID exposure.  She got the flu vaccine.  Family member states that the cough is waking her up at night.  She has tried Vicks vapor rub and Promethazine DM without improvement in her symptoms.  Symptoms are worse with being up. Patient has a past medical history of diabetes, hypertension, GERD.  PCP: [facility] [place].  Past Medical History:  Diagnosis Date   Diabetes mellitus without complication (HCC)    GERD (gastroesophageal reflux disease)    Goiter    Heart murmur    mentioned at age 77. never noticed again.   Hypertension     Past Surgical History:  Procedure Laterality Date   ABDOMINAL HYSTERECTOMY     BREAST BIOPSY Left 09/22/2009   us/ bx- neg   BREAST BIOPSY Left 02/26/2019   stereo bx, ribbon clip, pending path    BREAST CYST EXCISION Left    neg   CESAREAN SECTION     COLONOSCOPY WITH PROPOFOL N/A 01/19/2019   Procedure: COLONOSCOPY WITH BIOPSIES;  Surgeon: Midge Minium, MD;  Location: [facility];  Service: Endoscopy;  Laterality: N/A;   ESOPHAGEAL DILATION N/A 01/19/2019   Procedure: ESOPHAGEAL DILATION;  Surgeon: Midge Minium, MD;  Location: [facility];  Service: Endoscopy;  Laterality: N/A;   ESOPHAGOGASTRODUODENOSCOPY (EGD) WITH PROPOFOL N/A 01/19/2019   Procedure: ESOPHAGOGASTRODUODENOSCOPY (EGD) WITH BIOPSIES and DILATION;  Surgeon: Midge Minium, MD;  Location: [facility];  Service: Endoscopy;  Laterality: N/A;  Diabetic - oral meds   LAPAROTOMY     OOPHORECTOMY     OVARIAN CYST REMOVAL     POLYPECTOMY N/A 01/19/2019   Procedure: POLYPECTOMY;   Surgeon: Midge Minium, MD;  Location: [facility];  Service: Endoscopy;  Laterality: N/A;    Family History  Problem Relation Age of Onset   Colon cancer Mother 37   Hypertension Father    Prostate cancer Father    Breast cancer Neg Hx     Social History   Tobacco Use   Smoking status: Never   Smokeless tobacco: Never  Vaping Use   Vaping status: Never Used  Substance Use Topics   Alcohol use: Not Currently    Comment: occassionally - Holidays   Drug use: No    No current facility-administered medications for this encounter.  Current Outpatient Medications:    chlorpheniramine-HYDROcodone (TUSSIONEX) 10-8 MG/5ML, Take 5 mLs by mouth every 12 (twelve) hours as needed for cough., Disp: 60 mL, Rfl: 0   oseltamivir (TAMIFLU) 75 MG capsule, Take 1 capsule (75 mg total) by mouth 2 (two) times daily. X 5 days, Disp: 10 capsule, Rfl: 0   albuterol (VENTOLIN HFA) 108 (90 Base) MCG/ACT inhaler, Inhale 2 puffs into the lungs every 4 (four) hours as needed., Disp: 18 g, Rfl: 0   aspirin EC 81 MG tablet, Take 81 mg by mouth daily., Disp: , Rfl:    cholecalciferol (VITAMIN D3) 25 MCG (1000 UT) tablet, Take 1,000 Units by mouth daily., Disp: , Rfl:    Cobalamin Combinations (B-12) [phone] MCG SUBL, , Disp: , Rfl:    Continuous Glucose Sensor (FREESTYLE LIBRE 3 SENSOR) MISC,  USE 1 EACH EVERY 15 (FIFTEEN) DAYS, Disp: , Rfl:    glipiZIDE (GLUCOTROL) 5 MG tablet, Take 10 mg by mouth 2 (two) times daily., Disp: , Rfl:    hydrochlorothiazide (HYDRODIURIL) 25 MG tablet, Take 25 mg by mouth daily., Disp: , Rfl:    ipratropium (ATROVENT) 0.06 % nasal spray, Place 2 sprays into both nostrils 4 (four) times daily., Disp: 15 mL, Rfl: 12   metFORMIN (GLUCOPHAGE) 500 MG tablet, Take 1 tablet (500 mg total) by mouth 2 (two) times daily with a meal., Disp: 180 tablet, Rfl: 1   Multiple Vitamin (MULTI-VITAMIN) tablet, Take 1 tablet by mouth daily., Disp: , Rfl:    Spacer/Aero-Holding Chambers  (AEROCHAMBER MV) inhaler, Use as instructed, Disp: 1 each, Rfl: 2   VITAMIN E PO, Take by mouth daily., Disp: , Rfl:   No Known Allergies   ROS  As noted in HPI.   Physical Exam  BP 130/71 (BP Location: Left Arm)   Pulse (!) 110   Temp (!) 101 F (38.3 C) (Oral)   Resp 14   Ht 5\' 6"  (1.676 m)   Wt 80.3 kg   SpO2 100%   BMI 28.57 kg/m   Constitutional: Well developed, well nourished, no acute distress Eyes: PERRL, EOMI, conjunctiva normal bilaterally HENT: Normocephalic, atraumatic,mucus membranes moist.  Positive nasal congestion.  No maxillary or frontal sinus tenderness.  Normal oropharynx.  No postnasal drip. Neck: Positive cervical lymphadenopathy Respiratory: Clear to auscultation bilaterally, no rales, no wheezing, no rhonchi.  Positive anterior chest wall tenderness Cardiovascular: Regular tachycardia, no murmurs, no gallops, no rubs GI: Nondistended. skin: No rash, skin intact Musculoskeletal: no deformities Neurologic: Alert & oriented x 3, CN III-XII grossly intact, no motor deficits, sensation grossly intact Psychiatric: Speech and behavior appropriate   ED Course   Medications  acetaminophen (TYLENOL) tablet 975 mg (975 mg Oral Given 12/22/23 1617)    Orders Placed This Encounter  Procedures   Resp Panel by RT-PCR (Flu A&B, Covid) Anterior Nasal Swab    Standing Status:   Standing    Number of Occurrences:   1   Results for orders placed or performed during the hospital encounter of 12/22/23 (from the past 24 hours)  Resp Panel by RT-PCR (Flu A&B, Covid) Anterior Nasal Swab     Status: Abnormal   Collection Time: 12/22/23  4:14 PM   Specimen: Anterior Nasal Swab  Result Value Ref Range   SARS Coronavirus 2 by RT PCR NEGATIVE NEGATIVE   Influenza A by PCR POSITIVE (A) NEGATIVE   Influenza B by PCR NEGATIVE NEGATIVE   No results found.  ED Clinical Impression  1. Influenza A      ED Assessment/Plan     Patient presents with acute illness  with systemic symptoms of fever and tachycardia.  She was given Tylenol without much improvement.  Influenza A positive.  Home with Tamiflu, Tussionex, Tylenol/ibuprofen.  Discontinue Promethazine DM as it is not working for her.  Work note.  Push fluids.  Rest.  [place] Narcotic database reviewed for this patient, and feel that the risk/benefit ratio today is favorable for proceeding with a prescription for controlled substance.  Last opiate prescription was in April 2024   Discussed labs, MDM, treatment plan, and plan for follow-up with patient and family member.  Discussed sn/sx that should prompt return to the ED. they agree with plan.   Meds ordered this encounter  Medications   acetaminophen (TYLENOL) tablet 975 mg   oseltamivir (TAMIFLU)  75 MG capsule    Sig: Take 1 capsule (75 mg total) by mouth 2 (two) times daily. X 5 days    Dispense:  10 capsule    Refill:  0   chlorpheniramine-HYDROcodone (TUSSIONEX) 10-8 MG/5ML    Sig: Take 5 mLs by mouth every 12 (twelve) hours as needed for cough.    Dispense:  60 mL    Refill:  0      *This clinic note was created using Scientist, clinical (histocompatibility and immunogenetics). Therefore, there may be occasional mistakes despite careful proofreading. ?    Domenick Gong, MD 12/23/23 1202

## 2023-12-22 NOTE — Discharge Instructions (Addendum)
Tamiflu, even if you feel better.  Discontinue the Promethazine DM.  Use the Tussionex instead for cough.  Take 600 mg of ibuprofen combined with 1000 mg of Tylenol 3-4 times a day as needed for body aches, headaches.  You can take 2 puffs from your albuterol inhaler using your spacer every 4-6 hours as needed prescribed to you on January 2.

## 2023-12-23 ENCOUNTER — Ambulatory Visit: Payer: BC Managed Care – PPO

## 2024-01-02 DIAGNOSIS — R7989 Other specified abnormal findings of blood chemistry: Secondary | ICD-10-CM | POA: Diagnosis not present

## 2024-01-02 DIAGNOSIS — K760 Fatty (change of) liver, not elsewhere classified: Secondary | ICD-10-CM | POA: Diagnosis not present

## 2024-01-03 DIAGNOSIS — K828 Other specified diseases of gallbladder: Secondary | ICD-10-CM | POA: Diagnosis not present

## 2024-01-03 DIAGNOSIS — K760 Fatty (change of) liver, not elsewhere classified: Secondary | ICD-10-CM | POA: Diagnosis not present

## 2024-01-03 DIAGNOSIS — R109 Unspecified abdominal pain: Secondary | ICD-10-CM | POA: Diagnosis not present

## 2024-01-04 DIAGNOSIS — E119 Type 2 diabetes mellitus without complications: Secondary | ICD-10-CM | POA: Diagnosis not present

## 2024-01-20 DIAGNOSIS — E1165 Type 2 diabetes mellitus with hyperglycemia: Secondary | ICD-10-CM | POA: Diagnosis not present

## 2024-01-23 ENCOUNTER — Telehealth: Payer: Self-pay

## 2024-01-23 MED ORDER — NA SULFATE-K SULFATE-MG SULF 17.5-3.13-1.6 GM/177ML PO SOLN: 1.0000 | Freq: Once | ORAL | 0 refills | Status: AC

## 2024-01-23 NOTE — Telephone Encounter (Signed)
 The patient left a voicemail requesting her prep for the procedure scheduled on Monday. She mentioned that she went to [facility] in [place] yesterday, but they did not have the prep available. She asked if the nurse could call the prescription in to the [facility] located at [address] [place], [place], [place] [address]. I returned her call to confirm receipt of the voicemail and informed her that the message has been forwarded to the nurse. I advised her that if the nurse does not respond, she should expect a callback within 24 to 48 hours.

## 2024-01-23 NOTE — Addendum Note (Signed)
 Addended by: Roena Malady on: 01/23/2024 10:34 AM   Modules accepted: Orders

## 2024-01-23 NOTE — Telephone Encounter (Signed)
Rx sent through e-scribe  

## 2024-01-24 DIAGNOSIS — E1159 Type 2 diabetes mellitus with other circulatory complications: Secondary | ICD-10-CM | POA: Diagnosis not present

## 2024-01-24 DIAGNOSIS — E538 Deficiency of other specified B group vitamins: Secondary | ICD-10-CM | POA: Diagnosis not present

## 2024-01-24 DIAGNOSIS — E1169 Type 2 diabetes mellitus with other specified complication: Secondary | ICD-10-CM | POA: Diagnosis not present

## 2024-01-24 DIAGNOSIS — E1165 Type 2 diabetes mellitus with hyperglycemia: Secondary | ICD-10-CM | POA: Diagnosis not present

## 2024-01-27 ENCOUNTER — Encounter
Admission: RE | Disposition: A | Discharge status: Home / Self Care | Payer: Self-pay | Source: Ambulatory Visit | Attending: Gastroenterology

## 2024-01-27 ENCOUNTER — Ambulatory Visit
Admission: RE | Admit: 2024-01-27 | Discharge: 2024-01-27 | Disposition: A | Discharge status: Home / Self Care | Payer: BC Managed Care – PPO | Source: Ambulatory Visit | Attending: Gastroenterology | Admitting: Gastroenterology

## 2024-01-27 ENCOUNTER — Encounter: Payer: Self-pay | Admitting: Gastroenterology

## 2024-01-27 ENCOUNTER — Ambulatory Visit: Admitting: Anesthesiology

## 2024-01-27 ENCOUNTER — Other Ambulatory Visit: Payer: Self-pay

## 2024-01-27 DIAGNOSIS — Z7984 Long term (current) use of oral hypoglycemic drugs: Secondary | ICD-10-CM | POA: Insufficient documentation

## 2024-01-27 DIAGNOSIS — Z1211 Encounter for screening for malignant neoplasm of colon: Secondary | ICD-10-CM | POA: Diagnosis not present

## 2024-01-27 DIAGNOSIS — D125 Benign neoplasm of sigmoid colon: Secondary | ICD-10-CM | POA: Diagnosis not present

## 2024-01-27 DIAGNOSIS — E119 Type 2 diabetes mellitus without complications: Secondary | ICD-10-CM | POA: Diagnosis not present

## 2024-01-27 DIAGNOSIS — K640 First degree hemorrhoids: Secondary | ICD-10-CM | POA: Diagnosis not present

## 2024-01-27 DIAGNOSIS — K635 Polyp of colon: Secondary | ICD-10-CM

## 2024-01-27 DIAGNOSIS — I10 Essential (primary) hypertension: Secondary | ICD-10-CM | POA: Insufficient documentation

## 2024-01-27 DIAGNOSIS — K5730 Diverticulosis of large intestine without perforation or abscess without bleeding: Secondary | ICD-10-CM | POA: Diagnosis not present

## 2024-01-27 DIAGNOSIS — Z860100 Personal history of colon polyps, unspecified: Secondary | ICD-10-CM

## 2024-01-27 HISTORY — PX: POLYPECTOMY: SHX5525

## 2024-01-27 HISTORY — PX: COLONOSCOPY WITH PROPOFOL: SHX5780

## 2024-01-27 LAB — GLUCOSE, CAPILLARY: Glucose-Capillary: 176 mg/dL — ABNORMAL HIGH (ref 70–99)

## 2024-01-27 SURGERY — COLONOSCOPY WITH PROPOFOL
Anesthesia: General

## 2024-01-27 MED ORDER — PROPOFOL 10 MG/ML IV BOLUS
INTRAVENOUS | Status: DC | PRN
  Administered 2024-01-27: 30 mg via INTRAVENOUS
  Administered 2024-01-27: 60 mg via INTRAVENOUS

## 2024-01-27 MED ORDER — MIDAZOLAM HCL 2 MG/2ML IJ SOLN
INTRAMUSCULAR | Status: AC
  Filled 2024-01-27: qty 2

## 2024-01-27 MED ORDER — SODIUM CHLORIDE 0.9 % IV SOLN: INTRAVENOUS | Status: DC

## 2024-01-27 MED ORDER — PROPOFOL 500 MG/50ML IV EMUL
INTRAVENOUS | Status: DC | PRN
  Administered 2024-01-27: 165 ug/kg/min via INTRAVENOUS

## 2024-01-27 MED ORDER — LIDOCAINE HCL (CARDIAC) PF 100 MG/5ML IV SOSY
PREFILLED_SYRINGE | INTRAVENOUS | Status: DC | PRN
  Administered 2024-01-27: 100 mg via INTRAVENOUS

## 2024-01-27 NOTE — Transfer of Care (Signed)
 Immediate Anesthesia Transfer of Care Note  Patient: April Webb  Procedure(s) Performed: COLONOSCOPY WITH PROPOFOL POLYPECTOMY  Patient Location: Endoscopy Unit  Anesthesia Type:General  Level of Consciousness: drowsy and patient cooperative  Airway & Oxygen Therapy: Patient Spontanous Breathing and Patient connected to face mask oxygen  Post-op Assessment: Report given to RN and Post -op Vital signs reviewed and stable  Post vital signs: Reviewed and stable  Last Vitals:  Vitals Value Taken Time  BP 121/63 01/27/24 1010  Temp 36.5 C 01/27/24 1009  Pulse 83 01/27/24 1012  Resp 19 01/27/24 1012  SpO2 100 % 01/27/24 1012  Vitals shown include unfiled device data.  Last Pain:  Vitals:   01/27/24 1009  TempSrc: Temporal  PainSc: 0-No pain         Complications: No notable events documented.

## 2024-01-27 NOTE — Anesthesia Preprocedure Evaluation (Addendum)
 Anesthesia Evaluation  Patient identified by MRN, date of birth, ID band Patient awake    Reviewed: Allergy & Precautions, H&P , NPO status , Patient's Chart, lab work & pertinent test results  Airway Mallampati: II  TM Distance: >3 FB Neck ROM: full    Dental no notable dental hx.    Pulmonary neg pulmonary ROS   Pulmonary exam normal        Cardiovascular Exercise Tolerance: Good hypertension, Normal cardiovascular exam     Neuro/Psych negative neurological ROS  negative psych ROS   GI/Hepatic Neg liver ROS,GERD  ,,  Endo/Other  diabetes    Renal/GU negative Renal ROS  negative genitourinary   Musculoskeletal   Abdominal Normal abdominal exam  (+)   Peds  Hematology negative hematology ROS (+)   Anesthesia Other Findings Past Medical History: No date: Diabetes mellitus without complication (HCC) No date: GERD (gastroesophageal reflux disease) No date: Goiter No date: Heart murmur     Comment:  mentioned at age 75. never noticed again. No date: Hypertension  Past Surgical History: No date: ABDOMINAL HYSTERECTOMY 09/22/2009: BREAST BIOPSY; Left     Comment:  us/ bx- neg 02/26/2019: BREAST BIOPSY; Left     Comment:  stereo bx, ribbon clip, pending path  No date: BREAST CYST EXCISION; Left     Comment:  neg No date: CESAREAN SECTION 01/19/2019: COLONOSCOPY WITH PROPOFOL; N/A     Comment:  Procedure: COLONOSCOPY WITH BIOPSIES;  Surgeon: Midge Minium, MD;  Location: [facility];  Service:               Endoscopy;  Laterality: N/A; 01/19/2019: ESOPHAGEAL DILATION; N/A     Comment:  Procedure: ESOPHAGEAL DILATION;  Surgeon: Midge Minium,               MD;  Location: [facility];  Service: Endoscopy;               Laterality: N/A; 01/19/2019: ESOPHAGOGASTRODUODENOSCOPY (EGD) WITH PROPOFOL; N/A     Comment:  Procedure: ESOPHAGOGASTRODUODENOSCOPY (EGD) WITH               BIOPSIES and  DILATION;  Surgeon: Midge Minium, MD;                Location: [facility];  Service: Endoscopy;                Laterality: N/A;  Diabetic - oral meds No date: LAPAROTOMY No date: OOPHORECTOMY No date: OVARIAN CYST REMOVAL 01/19/2019: POLYPECTOMY; N/A     Comment:  Procedure: POLYPECTOMY;  Surgeon: Midge Minium, MD;                Location: [facility];  Service: Endoscopy;                Laterality: N/A;     Reproductive/Obstetrics negative OB ROS                             Anesthesia Physical Anesthesia Plan  ASA: 2  Anesthesia Plan: General   Post-op Pain Management:    Induction:   PONV Risk Score and Plan: Propofol infusion and TIVA  Airway Management Planned:   Additional Equipment:   Intra-op Plan:   Post-operative Plan:   Informed Consent: I have reviewed the patients History and Physical, chart, labs and discussed the procedure including  the risks, benefits and alternatives for the proposed anesthesia with the patient or authorized representative who has indicated his/her understanding and acceptance.     Dental Advisory Given  Plan Discussed with: CRNA and Surgeon  Anesthesia Plan Comments:         Anesthesia Quick Evaluation

## 2024-01-27 NOTE — Op Note (Signed)
 [facility] Gastroenterology Patient Name: April Webb Procedure Date: 01/27/2024 9:09 AM MRN: [id number] Account #: [id number] Date of Birth: 05/29/1961 Admit Type: Outpatient Age: 63 Room: [facility] ENDO ROOM 4 Gender: Female Note Status: Finalized Instrument Name: Prentice Docker [id number] Procedure:             Colonoscopy Indications:           High risk colon cancer surveillance: Personal history                         of colonic polyps Providers:             Midge Minium MD, MD Referring MD:          Duanne Limerick, MD (Referring MD) Medicines:             Propofol per Anesthesia Complications:         No immediate complications. Procedure:             Pre-Anesthesia Assessment:                        - Prior to the procedure, a History and Physical was                         performed, and patient medications and allergies were                         reviewed. The patient's tolerance of previous                         anesthesia was also reviewed. The risks and benefits                         of the procedure and the sedation options and risks                         were discussed with the patient. All questions were                         answered, and informed consent was obtained. Prior                         Anticoagulants: The patient has taken no anticoagulant                         or antiplatelet agents. ASA Grade Assessment: II - A                         patient with mild systemic disease. After reviewing                         the risks and benefits, the patient was deemed in                         satisfactory condition to undergo the procedure.                        After obtaining informed consent, the colonoscope was  passed under direct vision. Throughout the procedure,                         the patient's blood pressure, pulse, and oxygen                         saturations were monitored continuously. The                          Colonoscope was introduced through the anus and                         advanced to the the cecum, identified by appendiceal                         orifice and ileocecal valve. The colonoscopy was                         performed without difficulty. The patient tolerated                         the procedure well. The quality of the bowel                         preparation was excellent. Findings:      The perianal and digital rectal examinations were normal.      A 5 mm polyp was found in the sigmoid colon. The polyp was sessile. The       polyp was removed with a cold snare. Resection and retrieval were       complete.      Non-bleeding internal hemorrhoids were found during retroflexion. The       hemorrhoids were Grade I (internal hemorrhoids that do not prolapse).      The exam was otherwise without abnormality on direct and retroflexion       views.      A few small-mouthed diverticula were found in the sigmoid colon. Impression:            - One 5 mm polyp in the sigmoid colon, removed with a                         cold snare. Resected and retrieved.                        - Non-bleeding internal hemorrhoids.                        - The examination was otherwise normal on direct and                         retroflexion views.                        - Diverticulosis in the sigmoid colon. Recommendation:        - Discharge patient to home.                        - Resume previous diet.                        -  Continue present medications.                        - Await pathology results.                        - Repeat colonoscopy in 7 years for surveillance. Procedure Code(s):     --- Professional ---                        [phone], Colonoscopy, flexible; with removal of                         tumor(s), polyp(s), or other lesion(s) by snare                         technique Diagnosis Code(s):     --- Professional ---                        Z86.010,  Personal history of colonic polyps                        D12.5, Benign neoplasm of sigmoid colon CPT copyright 2022 [employer]. All rights reserved. The codes documented in this report are preliminary and upon coder review may  be revised to meet current compliance requirements. Midge Minium MD, MD 01/27/2024 10:13:03 AM This report has been signed electronically. Number of Addenda: 0 Note Initiated On: 01/27/2024 9:09 AM Scope Withdrawal Time: 0 hours 7 minutes 53 seconds  Total Procedure Duration: 0 hours 10 minutes 34 seconds  Estimated Blood Loss:  Estimated blood loss: none.      [facility]

## 2024-01-27 NOTE — Anesthesia Procedure Notes (Signed)
 Procedure Name: General with mask airway Date/Time: 01/27/2024 9:52 AM  Performed by: Mohammed Kindle, CRNAPre-anesthesia Checklist: Patient identified, Emergency Drugs available, Suction available and Patient being monitored Patient Re-evaluated:Patient Re-evaluated prior to induction Oxygen Delivery Method: Simple face mask Induction Type: IV induction Placement Confirmation: positive ETCO2 and breath sounds checked- equal and bilateral Dental Injury: Teeth and Oropharynx as per pre-operative assessment

## 2024-01-27 NOTE — H&P (Signed)
 Midge Minium, MD [facility] [address] [place], [place] [address] Phone:[phone] Fax : [phone]  Primary Care Physician:  Ardyth Gal, MD Primary Gastroenterologist:  Dr. Servando Snare  Pre-Procedure History & Physical: HPI:  April Webb is a 63 y.o. female is here for an colonoscopy.   Past Medical History:  Diagnosis Date   Diabetes mellitus without complication (HCC)    GERD (gastroesophageal reflux disease)    Goiter    Heart murmur    mentioned at age 36. never noticed again.   Hypertension     Past Surgical History:  Procedure Laterality Date   ABDOMINAL HYSTERECTOMY     BREAST BIOPSY Left 09/22/2009   us/ bx- neg   BREAST BIOPSY Left 02/26/2019   stereo bx, ribbon clip, pending path    BREAST CYST EXCISION Left    neg   CESAREAN SECTION     COLONOSCOPY WITH PROPOFOL N/A 01/19/2019   Procedure: COLONOSCOPY WITH BIOPSIES;  Surgeon: Midge Minium, MD;  Location: [facility];  Service: Endoscopy;  Laterality: N/A;   ESOPHAGEAL DILATION N/A 01/19/2019   Procedure: ESOPHAGEAL DILATION;  Surgeon: Midge Minium, MD;  Location: [facility];  Service: Endoscopy;  Laterality: N/A;   ESOPHAGOGASTRODUODENOSCOPY (EGD) WITH PROPOFOL N/A 01/19/2019   Procedure: ESOPHAGOGASTRODUODENOSCOPY (EGD) WITH BIOPSIES and DILATION;  Surgeon: Midge Minium, MD;  Location: [facility];  Service: Endoscopy;  Laterality: N/A;  Diabetic - oral meds   LAPAROTOMY     OOPHORECTOMY     OVARIAN CYST REMOVAL     POLYPECTOMY N/A 01/19/2019   Procedure: POLYPECTOMY;  Surgeon: Midge Minium, MD;  Location: [facility];  Service: Endoscopy;  Laterality: N/A;    Prior to Admission medications   Medication Sig Start Date End Date Taking? Authorizing Provider  albuterol (VENTOLIN HFA) 108 (90 Base) MCG/ACT inhaler Inhale 2 puffs into the lungs every 4 (four) hours as needed. 11/07/23   Becky Augusta, NP  aspirin EC 81 MG tablet Take 81 mg by mouth daily.    [provider]  chlorpheniramine-HYDROcodone (TUSSIONEX) 10-8 MG/5ML Take 5 mLs by mouth every 12 (twelve) hours as needed for cough. 12/22/23   Domenick Gong, MD  cholecalciferol (VITAMIN D3) 25 MCG (1000 UT) tablet Take 1,000 Units by mouth daily.    [provider]  Cobalamin Combinations (B-12) [phone] MCG SUBL  07/23/22   [provider]  Continuous Glucose Sensor (FREESTYLE LIBRE 3 SENSOR) MISC USE 1 EACH EVERY 15 (FIFTEEN) DAYS 10/23/23   [provider]  glipiZIDE (GLUCOTROL) 5 MG tablet Take 10 mg by mouth 2 (two) times daily. 06/11/22   [provider]  hydrochlorothiazide (HYDRODIURIL) 25 MG tablet Take 25 mg by mouth daily. 07/09/22   [provider]  ipratropium (ATROVENT) 0.06 % nasal spray Place 2 sprays into both nostrils 4 (four) times daily. 11/07/23   Becky Augusta, NP  metFORMIN (GLUCOPHAGE) 500 MG tablet Take 1 tablet (500 mg total) by mouth 2 (two) times daily with a meal. 07/20/19   Duanne Limerick, MD  Multiple Vitamin (MULTI-VITAMIN) tablet Take 1 tablet by mouth daily.    [provider]  oseltamivir (TAMIFLU) 75 MG capsule Take 1 capsule (75 mg total) by mouth 2 (two) times daily. X 5 days 12/22/23   Domenick Gong, MD  Spacer/Aero-Holding Chambers (AEROCHAMBER MV) inhaler Use as instructed 11/07/23   Becky Augusta, NP  VITAMIN E PO Take by mouth daily.    [provider]  Allergies as of 11/08/2023   (No Known Allergies)    Family History  Problem Relation Age of Onset   Colon cancer Mother 24   Hypertension Father    Prostate cancer Father    Breast cancer Neg Hx     Social History   Socioeconomic History   Marital status: Married    Spouse name: Not on file   Number of children: Not on file   Years of education: Not on file   Highest education level: Not on file  Occupational History   Not on file  Tobacco Use   Smoking status: Never   Smokeless tobacco: Never  Vaping Use   Vaping status:  Never Used  Substance and Sexual Activity   Alcohol use: Not Currently    Comment: occassionally - Holidays   Drug use: No   Sexual activity: Not Currently    Birth control/protection: Surgical    Comment: Hysterectomy  Other Topics Concern   Not on file  Social History Narrative   Not on file   Social Drivers of Health   Financial Resource Strain: Low Risk  (08/09/2022)   Received from [facility]   Overall Financial Resource Strain (CARDIA)    Difficulty of Paying Living Expenses: Not hard at all  Food Insecurity: No Food Insecurity (08/09/2022)   Received from [facility], [facility]   Hunger Vital Sign    Worried About Running Out of Food in the Last Year: Never true    Ran Out of Food in the Last Year: Never true  Transportation Needs: Not on file  Physical Activity: Not on file  Stress: Not on file  Social Connections: Not on file  Intimate Partner Violence: Not on file    Review of Systems: See HPI, otherwise negative ROS  Physical Exam: There were no vitals taken for this visit. General:   Alert,  pleasant and cooperative in NAD Head:  Normocephalic and atraumatic. Neck:  Supple; no masses or thyromegaly. Lungs:  Clear throughout to auscultation.    Heart:  Regular rate and rhythm. Abdomen:  Soft, nontender and nondistended. Normal bowel sounds, without guarding, and without rebound.   Neurologic:  Alert and  oriented x4;  grossly normal neurologically.  Impression/Plan: April Webb is here for an colonoscopy to be performed for a history of adenomatous polyps on 2020   Risks, benefits, limitations, and alternatives regarding  colonoscopy have been reviewed with the patient.  Questions have been answered.  All parties agreeable.   Midge Minium, MD  01/27/2024, 9:12 AM

## 2024-01-28 LAB — SURGICAL PATHOLOGY

## 2024-01-28 NOTE — Anesthesia Postprocedure Evaluation (Signed)
 Anesthesia Post Note  Patient: April Webb  Procedure(s) Performed: COLONOSCOPY WITH PROPOFOL POLYPECTOMY  Patient location during evaluation: Endoscopy Anesthesia Type: General Level of consciousness: awake and alert Pain management: pain level controlled Vital Signs Assessment: post-procedure vital signs reviewed and stable Respiratory status: spontaneous breathing, nonlabored ventilation and respiratory function stable Cardiovascular status: blood pressure returned to baseline and stable Postop Assessment: no apparent nausea or vomiting Anesthetic complications: no   No notable events documented.   Last Vitals:  Vitals:   01/27/24 1009 01/27/24 1029  BP: 121/63 129/73  Pulse: 76   Resp: 17   Temp: 36.5 C   SpO2: 100%     Last Pain:  Vitals:   01/28/24 0735  TempSrc:   PainSc: 0-No pain                 Foye Deer

## 2024-01-28 NOTE — Anesthesia Postprocedure Evaluation (Signed)
 Anesthesia Post Note  Patient: ANISIA LEIJA  Procedure(s) Performed: COLONOSCOPY WITH PROPOFOL POLYPECTOMY  Patient location during evaluation: Endoscopy Anesthesia Type: General Level of consciousness: awake and alert Pain management: pain level controlled Vital Signs Assessment: post-procedure vital signs reviewed and stable Respiratory status: spontaneous breathing, nonlabored ventilation and respiratory function stable Cardiovascular status: blood pressure returned to baseline and stable Postop Assessment: no apparent nausea or vomiting Anesthetic complications: no   No notable events documented.   Last Vitals:  Vitals:   01/27/24 1009 01/27/24 1029  BP: 121/63 129/73  Pulse: 76   Resp: 17   Temp: 36.5 C   SpO2: 100%     Last Pain:  Vitals:   01/28/24 0735  TempSrc:   PainSc: 0-No pain                 Foye Deer

## 2024-01-30 ENCOUNTER — Encounter: Payer: Self-pay | Admitting: Gastroenterology

## 2024-01-30 ENCOUNTER — Ambulatory Visit: Admission: EM | Admit: 2024-01-30 | Discharge: 2024-01-30 | Attending: Family Medicine | Admitting: Family Medicine

## 2024-01-30 DIAGNOSIS — I10 Essential (primary) hypertension: Secondary | ICD-10-CM | POA: Diagnosis not present

## 2024-01-30 DIAGNOSIS — R079 Chest pain, unspecified: Secondary | ICD-10-CM | POA: Diagnosis not present

## 2024-01-30 DIAGNOSIS — E162 Hypoglycemia, unspecified: Secondary | ICD-10-CM | POA: Diagnosis not present

## 2024-01-30 DIAGNOSIS — E11649 Type 2 diabetes mellitus with hypoglycemia without coma: Secondary | ICD-10-CM | POA: Diagnosis not present

## 2024-01-30 DIAGNOSIS — R059 Cough, unspecified: Secondary | ICD-10-CM | POA: Diagnosis not present

## 2024-01-30 DIAGNOSIS — Z7984 Long term (current) use of oral hypoglycemic drugs: Secondary | ICD-10-CM | POA: Diagnosis not present

## 2024-01-30 DIAGNOSIS — Z79899 Other long term (current) drug therapy: Secondary | ICD-10-CM | POA: Diagnosis not present

## 2024-01-30 LAB — GLUCOSE, CAPILLARY: Glucose-Capillary: 215 mg/dL — ABNORMAL HIGH (ref 70–99)

## 2024-01-30 NOTE — Discharge Instructions (Addendum)
Please go to the ER for further evaluation of your chest pain.

## 2024-01-30 NOTE — ED Triage Notes (Signed)
 Patient states that she's been having left sided chest pain. Doesn't radiate or get worse with breathing.   Patient states that her reading on her April Webb was reading 55 and is now reading 170 after drinking soda.

## 2024-01-30 NOTE — ED Provider Notes (Signed)
 [facility]    CSN: [id number] Arrival date & time: 01/30/24  1258      History   Chief Complaint Chief Complaint  Patient presents with   Chest Pain   Hypoglycemia    HPI April Webb is a 63 y.o. female presents for chest pain.  Patient's past medical history of diabetes and vitamin D deficiency as well as hypertension.  Patient reports she has been having an intermittent left-sided chest pain that today became persistent.  Rates her chest pain as an 8 out of 10 and states it does not radiate.  Denies any shortness of breath, nausea/vomiting, syncope.  Does endorse some dizziness.  Cannot identify any aggravating or alleviating factors associated with it.  Denies any smoking history.  Denies any family history of CAD.  She did have a colonoscopy 4 days ago.  Denies any recent URI symptoms.  She does report her blood sugar was low earlier around 54 for which she did eat some chocolate and drink a soda.  No other concerns at this time.   Chest Pain Associated symptoms: dizziness   Hypoglycemia Associated symptoms: dizziness     Past Medical History:  Diagnosis Date   Diabetes mellitus without complication (HCC)    GERD (gastroesophageal reflux disease)    Goiter    Heart murmur    mentioned at age 31. never noticed again.   Hypertension     Patient Active Problem List   Diagnosis Date Noted   History of colonic polyps 01/27/2024   Polyp of sigmoid colon 01/27/2024   Vitamin D deficiency 08/04/2019   Gastric polyp    Problems with swallowing and mastication    Family history of malignant neoplasm of gastrointestinal tract    Rectal polyp    Benign neoplasm of descending colon    Benign neoplasm of ascending colon    Type 2 diabetes mellitus without complication, without long-term current use of insulin (HCC) 12/11/2018   Vaginal atrophy 04/24/2017    Past Surgical History:  Procedure Laterality Date   ABDOMINAL HYSTERECTOMY     BREAST BIOPSY Left  09/22/2009   us/ bx- neg   BREAST BIOPSY Left 02/26/2019   stereo bx, ribbon clip, pending path    BREAST CYST EXCISION Left    neg   CESAREAN SECTION     COLONOSCOPY WITH PROPOFOL N/A 01/19/2019   Procedure: COLONOSCOPY WITH BIOPSIES;  Surgeon: Midge Minium, MD;  Location: [facility];  Service: Endoscopy;  Laterality: N/A;   COLONOSCOPY WITH PROPOFOL N/A 01/27/2024   Procedure: COLONOSCOPY WITH PROPOFOL;  Surgeon: Midge Minium, MD;  Location: [facility] END[facility]COPY;  Service: Endoscopy;  Laterality: N/A;  Patient states she is DM and needs an earlier time.   ESOPHAGEAL DILATION N/A 01/19/2019   Procedure: ESOPHAGEAL DILATION;  Surgeon: Midge Minium, MD;  Location: [facility];  Service: Endoscopy;  Laterality: N/A;   ESOPHAGOGASTRODUODENOSCOPY (EGD) WITH PROPOFOL N/A 01/19/2019   Procedure: ESOPHAGOGASTRODUODENOSCOPY (EGD) WITH BIOPSIES and DILATION;  Surgeon: Midge Minium, MD;  Location: [facility];  Service: Endoscopy;  Laterality: N/A;  Diabetic - oral meds   LAPAROTOMY     OOPHORECTOMY     OVARIAN CYST REMOVAL     POLYPECTOMY N/A 01/19/2019   Procedure: POLYPECTOMY;  Surgeon: Midge Minium, MD;  Location: [facility];  Service: Endoscopy;  Laterality: N/A;   POLYPECTOMY  01/27/2024   Procedure: POLYPECTOMY;  Surgeon: Midge Minium, MD;  Location: [facility] END[facility]COPY;  Service: Endoscopy;;    OB  History     Gravida  3   Para  3   Term  3   Preterm      AB      Living  3      SAB      IAB      Ectopic      Multiple      Live Births               Home Medications    Prior to Admission medications   Medication Sig Start Date End Date Taking? Authorizing Provider  aspirin EC 81 MG tablet Take 81 mg by mouth daily.   Yes [provider]  Continuous Glucose Sensor (FREESTYLE LIBRE 3 SENSOR) MISC USE 1 EACH EVERY 15 (FIFTEEN) DAYS 10/23/23  Yes [provider]  glipiZIDE (GLUCOTROL) 5 MG tablet Take 10 mg by mouth 2 (two) times  daily. 06/11/22  Yes [provider]  hydrochlorothiazide (HYDRODIURIL) 25 MG tablet Take 25 mg by mouth daily. 07/09/22  Yes [provider]  metFORMIN (GLUCOPHAGE) 500 MG tablet Take 1 tablet (500 mg total) by mouth 2 (two) times daily with a meal. 07/20/19  Yes Duanne Limerick, MD  Multiple Vitamin (MULTI-VITAMIN) tablet Take 1 tablet by mouth daily.   Yes [provider]  albuterol (VENTOLIN HFA) 108 (90 Base) MCG/ACT inhaler Inhale 2 puffs into the lungs every 4 (four) hours as needed. 11/07/23   Becky Augusta, NP  chlorpheniramine-HYDROcodone (TUSSIONEX) 10-8 MG/5ML Take 5 mLs by mouth every 12 (twelve) hours as needed for cough. 12/22/23   Domenick Gong, MD  cholecalciferol (VITAMIN D3) 25 MCG (1000 UT) tablet Take 1,000 Units by mouth daily.    [provider]  Cobalamin Combinations (B-12) [phone] MCG SUBL  07/23/22   [provider]  ipratropium (ATROVENT) 0.06 % nasal spray Place 2 sprays into both nostrils 4 (four) times daily. 11/07/23   Becky Augusta, NP  oseltamivir (TAMIFLU) 75 MG capsule Take 1 capsule (75 mg total) by mouth 2 (two) times daily. X 5 days Patient not taking: Reported on 01/27/2024 12/22/23   Domenick Gong, MD  Spacer/Aero-Holding Chambers (AEROCHAMBER MV) inhaler Use as instructed 11/07/23   Becky Augusta, NP  VITAMIN E PO Take by mouth daily.    [provider]    Family History Family History  Problem Relation Age of Onset   Colon cancer Mother 17   Hypertension Father    Prostate cancer Father    Breast cancer Neg Hx     Social History Social History   Tobacco Use   Smoking status: Never   Smokeless tobacco: Never  Vaping Use   Vaping status: Never Used  Substance Use Topics   Alcohol use: Not Currently    Comment: occassionally - Holidays   Drug use: No     Allergies   Patient has no known allergies.   Review of Systems Review of Systems  Cardiovascular:  Positive for chest pain.   Neurological:  Positive for dizziness.     Physical Exam Triage Vital Signs ED Triage Vitals  Encounter Vitals Group     BP 01/30/24 1310 (!) 166/81     Systolic BP Percentile --      Diastolic BP Percentile --      Pulse Rate 01/30/24 1310 85     Resp 01/30/24 1310 15     Temp 01/30/24 1310 98.4 F (36.9 C)     Temp Source 01/30/24 1310 Oral  SpO2 01/30/24 1310 99 %     Weight --      Height --      Head Circumference --      Peak Flow --      Pain Score 01/30/24 1309 8     Pain Loc --      Pain Education --      Exclude from Growth Chart --    No data found.  Updated Vital Signs BP (!) 166/81 (BP Location: Left Arm)   Pulse 85   Temp 98.4 F (36.9 C) (Oral)   Resp 15   SpO2 99%   Visual Acuity Right Eye Distance:   Left Eye Distance:   Bilateral Distance:    Right Eye Near:   Left Eye Near:    Bilateral Near:     Physical Exam Vitals and nursing note reviewed.  Constitutional:      General: She is not in acute distress.    Appearance: She is well-developed. She is not ill-appearing.  HENT:     Head: Normocephalic and atraumatic.     Mouth/Throat:     Mouth: Mucous membranes are moist.     Pharynx: Uvula midline.     Tonsils: No tonsillar exudate or tonsillar abscesses.  Eyes:     Conjunctiva/sclera: Conjunctivae normal.     Pupils: Pupils are equal, round, and reactive to light.  Cardiovascular:     Rate and Rhythm: Normal rate and regular rhythm.     Heart sounds: Normal heart sounds.  Pulmonary:     Effort: Pulmonary effort is normal.     Breath sounds: Normal breath sounds.  Chest:     Chest wall: No tenderness.  Musculoskeletal:     Cervical back: Normal range of motion and neck supple.  Lymphadenopathy:     Cervical: No cervical adenopathy.  Skin:    General: Skin is warm and dry.  Neurological:     General: No focal deficit present.     Mental Status: She is alert and oriented to person, place, and time.  Psychiatric:         Mood and Affect: Mood normal.        Behavior: Behavior normal.    Marburg score   Known clinical vascular disease  Age/sex (F >=65 years or M >=55 years)  Increased pain with exercise  Pain not reproducible by palpation  Patient assumes pain is of cardiac origin  Score ranges from 0 to 5 points. One point for each answer 0-2 points: negative result 3-5 points: positive result  Total score: 2  Interchest score  History of CAD  Age/sex (F >=65 years or M >=55 years)  Increased pain with exercise  Pain reproducible by palpation (-1 if yes) Physician assumes cardiac origin  Pain feels like 'pressure'   Score ranges from ?1 to +5 points. One point for each answer except reproducible by palpation  <2 points: CAD negative 2-5 points: CAD positive   Total score: 1   UC Treatments / Results  Labs (all labs ordered are listed, but only abnormal results are displayed) Labs Reviewed  GLUCOSE, CAPILLARY - Abnormal; Notable for the following components:      Result Value   Glucose-Capillary 215 (*)    All other components within normal limits  CBG MONITORING, ED    EKG   Radiology No results found.  Procedures ED EKG  Date/Time: 01/30/2024 1:30 PM  Performed by: Radford Pax, NP Authorized by: Radford Pax,  NP   ECG interpreted by ED Physician in the absence of a cardiologist: no   Previous ECG:    Previous ECG:  Compared to current   Similarity:  No change Interpretation:    Interpretation: abnormal   Rate:    ECG rate:  83   ECG rate assessment: normal   Rhythm:    Rhythm: sinus rhythm   Ectopy:    Ectopy: none   QRS:    QRS conduction: normal   ST segments:    ST segments:  Non-specific T waves:    T waves: non-specific    (including critical care time)  Medications Ordered in UC Medications - No data to display  Initial Impression / Assessment and Plan / UC Course  I have reviewed the triage vital signs and the nursing notes.  Pertinent  labs & imaging results that were available during my care of the patient were reviewed by me and considered in my medical decision making (see chart for details).     Reviewed exam and symptoms with patient.  Discussed limitations and abilities of urgent care.  EKG shows nonspecific changes with inferior infarct age undetermined, similar to previous EKG.  Blood sugar 215.  Patient's chest pain is nonreproducible.  Given her history/presentation I did advise that she go to the ER to rule out any cardiac/life-threatening causes of her symptoms.  She is in agreement with plan.  She declines EMS transfer and will go POV with family driving her.  She was instructed to pull over and call 911 for any worsening symptoms that occur in transit and she verbalized understanding. Final Clinical Impressions(s) / UC Diagnoses   Final diagnoses:  Chest pain, unspecified type     Discharge Instructions      Please go to the ER for further evaluation of your chest pain    ED Prescriptions   None    PDMP not reviewed this encounter.   Radford Pax, NP 01/30/24 1335

## 2024-01-31 DIAGNOSIS — E162 Hypoglycemia, unspecified: Secondary | ICD-10-CM | POA: Diagnosis not present

## 2024-02-03 DIAGNOSIS — Z09 Encounter for follow-up examination after completed treatment for conditions other than malignant neoplasm: Secondary | ICD-10-CM | POA: Diagnosis not present

## 2024-02-03 DIAGNOSIS — R079 Chest pain, unspecified: Secondary | ICD-10-CM | POA: Diagnosis not present

## 2024-02-03 DIAGNOSIS — E1169 Type 2 diabetes mellitus with other specified complication: Secondary | ICD-10-CM | POA: Diagnosis not present

## 2024-02-03 DIAGNOSIS — R9431 Abnormal electrocardiogram [ECG] [EKG]: Secondary | ICD-10-CM | POA: Diagnosis not present

## 2024-02-04 DIAGNOSIS — E119 Type 2 diabetes mellitus without complications: Secondary | ICD-10-CM | POA: Diagnosis not present

## 2024-02-12 DIAGNOSIS — Z09 Encounter for follow-up examination after completed treatment for conditions other than malignant neoplasm: Secondary | ICD-10-CM | POA: Diagnosis not present

## 2024-02-12 DIAGNOSIS — R079 Chest pain, unspecified: Secondary | ICD-10-CM | POA: Diagnosis not present

## 2024-02-12 DIAGNOSIS — E1169 Type 2 diabetes mellitus with other specified complication: Secondary | ICD-10-CM | POA: Diagnosis not present

## 2024-02-12 DIAGNOSIS — R9431 Abnormal electrocardiogram [ECG] [EKG]: Secondary | ICD-10-CM | POA: Diagnosis not present

## 2024-03-05 DIAGNOSIS — E119 Type 2 diabetes mellitus without complications: Secondary | ICD-10-CM | POA: Diagnosis not present

## 2024-04-05 DIAGNOSIS — E119 Type 2 diabetes mellitus without complications: Secondary | ICD-10-CM | POA: Diagnosis not present

## 2024-04-06 DIAGNOSIS — E7800 Pure hypercholesterolemia, unspecified: Secondary | ICD-10-CM | POA: Diagnosis not present

## 2024-04-06 DIAGNOSIS — E538 Deficiency of other specified B group vitamins: Secondary | ICD-10-CM | POA: Diagnosis not present

## 2024-04-06 DIAGNOSIS — E1169 Type 2 diabetes mellitus with other specified complication: Secondary | ICD-10-CM | POA: Diagnosis not present

## 2024-04-06 DIAGNOSIS — I10 Essential (primary) hypertension: Secondary | ICD-10-CM | POA: Diagnosis not present

## 2024-04-06 DIAGNOSIS — Z7189 Other specified counseling: Secondary | ICD-10-CM | POA: Diagnosis not present

## 2024-04-06 DIAGNOSIS — Z0001 Encounter for general adult medical examination with abnormal findings: Secondary | ICD-10-CM | POA: Diagnosis not present

## 2024-04-06 DIAGNOSIS — E1165 Type 2 diabetes mellitus with hyperglycemia: Secondary | ICD-10-CM | POA: Diagnosis not present

## 2024-04-06 DIAGNOSIS — R9431 Abnormal electrocardiogram [ECG] [EKG]: Secondary | ICD-10-CM | POA: Diagnosis not present

## 2024-05-05 DIAGNOSIS — E119 Type 2 diabetes mellitus without complications: Secondary | ICD-10-CM | POA: Diagnosis not present

## 2024-05-29 DIAGNOSIS — E1165 Type 2 diabetes mellitus with hyperglycemia: Secondary | ICD-10-CM | POA: Diagnosis not present

## 2024-05-29 DIAGNOSIS — E785 Hyperlipidemia, unspecified: Secondary | ICD-10-CM | POA: Diagnosis not present

## 2024-05-29 DIAGNOSIS — I152 Hypertension secondary to endocrine disorders: Secondary | ICD-10-CM | POA: Diagnosis not present

## 2024-05-29 DIAGNOSIS — E1169 Type 2 diabetes mellitus with other specified complication: Secondary | ICD-10-CM | POA: Diagnosis not present

## 2024-05-29 DIAGNOSIS — E559 Vitamin D deficiency, unspecified: Secondary | ICD-10-CM | POA: Diagnosis not present

## 2024-05-29 DIAGNOSIS — E538 Deficiency of other specified B group vitamins: Secondary | ICD-10-CM | POA: Diagnosis not present

## 2024-05-29 DIAGNOSIS — E1159 Type 2 diabetes mellitus with other circulatory complications: Secondary | ICD-10-CM | POA: Diagnosis not present

## 2024-06-05 DIAGNOSIS — E119 Type 2 diabetes mellitus without complications: Secondary | ICD-10-CM | POA: Diagnosis not present

## 2024-06-08 DIAGNOSIS — E1159 Type 2 diabetes mellitus with other circulatory complications: Secondary | ICD-10-CM | POA: Diagnosis not present

## 2024-06-08 DIAGNOSIS — E1165 Type 2 diabetes mellitus with hyperglycemia: Secondary | ICD-10-CM | POA: Diagnosis not present

## 2024-06-08 DIAGNOSIS — E1169 Type 2 diabetes mellitus with other specified complication: Secondary | ICD-10-CM | POA: Diagnosis not present

## 2024-06-08 DIAGNOSIS — E559 Vitamin D deficiency, unspecified: Secondary | ICD-10-CM | POA: Diagnosis not present

## 2024-07-06 DIAGNOSIS — E119 Type 2 diabetes mellitus without complications: Secondary | ICD-10-CM | POA: Diagnosis not present

## 2024-08-05 DIAGNOSIS — E119 Type 2 diabetes mellitus without complications: Secondary | ICD-10-CM | POA: Diagnosis not present

## 2024-09-05 DIAGNOSIS — E119 Type 2 diabetes mellitus without complications: Secondary | ICD-10-CM | POA: Diagnosis not present

## 2024-10-05 NOTE — Progress Notes (Unsigned)
 PCP: Alyse Bradley, MD   No chief complaint on file.   HPI:      [name] is a 63 y.o. [phone] whose LMP was No LMP recorded. Patient has had a hysterectomy., presents today for her annual examination.  Her menses are absent due to hyst/BSO. No PMB. She does have occas vasomotor sx.   Sex activity: occas sexually active due to pain. She does have vaginal dryness, improved with lubricants but still uncomfortable with occas scant bleeding/pain. Hasn't done vag ERT.   Last Pap: 07/11/18  Results were: no abnormalities /neg HPV DNA. No longer indicated  Last mammogram: 09/11/23 Results were: normal--routine follow-up in 12 months There is no FH of breast cancer. There is no FH of ovarian cancer. The patient does do self-breast exams. Hx of LT benign breast bx in past.   Colonoscopy: 3/25 with polyps, repeat due after 7 yrs now. 01/2019 with polyps with Dr.Wohl, repeat due after 5 years.  FH colon cancer in her mother.  Tobacco use: The patient denies current or previous tobacco use. Alcohol use: none No drug use Exercise: moderately active  She does get adequate calcium and Vitamin D  in her diet.  Labs with PCP.   Patient Active Problem List   Diagnosis Date Noted   History of colonic polyps 01/27/2024   Polyp of sigmoid colon 01/27/2024   Vitamin D  deficiency 08/04/2019   Gastric polyp    Problems with swallowing and mastication    Family history of malignant neoplasm of gastrointestinal tract    Rectal polyp    Benign neoplasm of descending colon    Benign neoplasm of ascending colon    Type 2 diabetes mellitus without complication, without long-term current use of insulin (HCC) 12/11/2018   Vaginal atrophy 04/24/2017    Past Surgical History:  Procedure Laterality Date   ABDOMINAL HYSTERECTOMY     BREAST BIOPSY Left 09/22/2009   us / bx- neg   BREAST BIOPSY Left 02/26/2019   stereo bx, ribbon clip, pending path    BREAST CYST EXCISION Left    neg    CESAREAN SECTION     COLONOSCOPY WITH PROPOFOL  N/A 01/19/2019   Procedure: COLONOSCOPY WITH BIOPSIES;  Surgeon: Jinny Carmine, MD;  Location: [facility];  Service: Endoscopy;  Laterality: N/A;   COLONOSCOPY WITH PROPOFOL  N/A 01/27/2024   Procedure: COLONOSCOPY WITH PROPOFOL ;  Surgeon: Jinny Carmine, MD;  Location: [facility] ENDOSCOPY;  Service: Endoscopy;  Laterality: N/A;  Patient states she is DM and needs an earlier time.   ESOPHAGEAL DILATION N/A 01/19/2019   Procedure: ESOPHAGEAL DILATION;  Surgeon: Jinny Carmine, MD;  Location: [facility];  Service: Endoscopy;  Laterality: N/A;   ESOPHAGOGASTRODUODENOSCOPY (EGD) WITH PROPOFOL  N/A 01/19/2019   Procedure: ESOPHAGOGASTRODUODENOSCOPY (EGD) WITH BIOPSIES and DILATION;  Surgeon: Jinny Carmine, MD;  Location: [facility];  Service: Endoscopy;  Laterality: N/A;  Diabetic - oral meds   LAPAROTOMY     OOPHORECTOMY     OVARIAN CYST REMOVAL     POLYPECTOMY N/A 01/19/2019   Procedure: POLYPECTOMY;  Surgeon: Jinny Carmine, MD;  Location: [facility];  Service: Endoscopy;  Laterality: N/A;   POLYPECTOMY  01/27/2024   Procedure: POLYPECTOMY;  Surgeon: Jinny Carmine, MD;  Location: [facility] ENDOSCOPY;  Service: Endoscopy;;    Family History  Problem Relation Age of Onset   Colon cancer Mother 21   Hypertension Father    Prostate cancer Father    Breast cancer Neg Hx  Social History   Socioeconomic History   Marital status: Married    Spouse name: Not on file   Number of children: Not on file   Years of education: Not on file   Highest education level: Not on file  Occupational History   Not on file  Tobacco Use   Smoking status: Never   Smokeless tobacco: Never  Vaping Use   Vaping status: Never Used  Substance and Sexual Activity   Alcohol use: Not Currently    Comment: occassionally - Holidays   Drug use: No   Sexual activity: Not Currently    Birth control/protection: Surgical    Comment: Hysterectomy  Other  Topics Concern   Not on file  Social History Narrative   Not on file   Social Drivers of Health   Financial Resource Strain: Low Risk (08/09/2022)   Received from [facility]   Overall Financial Resource Strain (CARDIA)    Difficulty of Paying Living Expenses: Not hard at all  Food Insecurity: No Food Insecurity (04/06/2024)   Received from [facility]   Hunger Vital Sign    Within the past 12 months, you worried that your food would run out before you got the money to buy more.: Never true    Within the past 12 months, the food you bought just didn't last and you didn't have money to get more.: Never true  Transportation Needs: No Transportation Needs (04/06/2024)   Received from [facility]   PRAPARE - Transportation    Lack of Transportation (Medical): No    Lack of Transportation (Non-Medical): No  Physical Activity: Not on file  Stress: Not on file  Social Connections: Not on file  Intimate Partner Violence: Not on file     Current Outpatient Medications:    albuterol  (VENTOLIN  HFA) 108 (90 Base) MCG/ACT inhaler, Inhale 2 puffs into the lungs every 4 (four) hours as needed., Disp: 18 g, Rfl: 0   aspirin EC 81 MG tablet, Take 81 mg by mouth daily., Disp: , Rfl:    chlorpheniramine-HYDROcodone (TUSSIONEX) 10-8 MG/5ML, Take 5 mLs by mouth every 12 (twelve) hours as needed for cough., Disp: 60 mL, Rfl: 0   cholecalciferol (VITAMIN D3) 25 MCG (1000 UT) tablet, Take 1,000 Units by mouth daily., Disp: , Rfl:    Cobalamin Combinations (B-12) [phone] MCG SUBL, , Disp: , Rfl:    Continuous Glucose Sensor (FREESTYLE LIBRE 3 SENSOR) MISC, USE 1 EACH EVERY 15 (FIFTEEN) DAYS, Disp: , Rfl:    glipiZIDE  (GLUCOTROL ) 5 MG tablet, Take 10 mg by mouth 2 (two) times daily., Disp: , Rfl:    hydrochlorothiazide  (HYDRODIURIL ) 25 MG tablet, Take 25 mg by mouth daily., Disp: , Rfl:    ipratropium (ATROVENT ) 0.06 % nasal spray, Place 2 sprays into both nostrils 4 (four) times daily., Disp: 15  mL, Rfl: 12   metFORMIN  (GLUCOPHAGE ) 500 MG tablet, Take 1 tablet (500 mg total) by mouth 2 (two) times daily with a meal., Disp: 180 tablet, Rfl: 1   Multiple Vitamin (MULTI-VITAMIN) tablet, Take 1 tablet by mouth daily., Disp: , Rfl:    oseltamivir  (TAMIFLU ) 75 MG capsule, Take 1 capsule (75 mg total) by mouth 2 (two) times daily. X 5 days (Patient not taking: Reported on 01/27/2024), Disp: 10 capsule, Rfl: 0   Spacer/Aero-Holding Chambers (AEROCHAMBER MV) inhaler, Use as instructed, Disp: 1 each, Rfl: 2   VITAMIN E PO, Take by mouth daily., Disp: , Rfl:  ROS:  Review of Systems  Constitutional:  Negative for fatigue, fever and unexpected weight change.  Respiratory:  Negative for cough, shortness of breath and wheezing.   Cardiovascular:  Negative for chest pain, palpitations and leg swelling.  Gastrointestinal:  Negative for blood in stool, constipation, diarrhea, nausea and vomiting.  Endocrine: Negative for cold intolerance, heat intolerance and polyuria.  Genitourinary:  Positive for dyspareunia. Negative for dysuria, flank pain, frequency, genital sores, hematuria, menstrual problem, pelvic pain, urgency, vaginal bleeding, vaginal discharge and vaginal pain.  Musculoskeletal:  Negative for back pain, joint swelling and myalgias.  Skin:  Negative for rash.  Neurological:  Negative for dizziness, syncope, light-headedness, numbness and headaches.  Hematological:  Negative for adenopathy.  Psychiatric/Behavioral:  Negative for agitation, confusion, sleep disturbance and suicidal ideas. The patient is not nervous/anxious.     Objective: There were no vitals taken for this visit.   Physical Exam Constitutional:      Appearance: She is well-developed.  Genitourinary:     Vulva normal.     Right Labia: No rash, tenderness or lesions.    Left Labia: No tenderness, lesions or rash.    No vaginal discharge, erythema or tenderness.     Mild vaginal atrophy present.     Right  Adnexa: not tender and no mass present.    Left Adnexa: not tender and no mass present.    No cervical friability or polyp.     Uterus is not enlarged or tender.  Breasts:    Right: No mass, nipple discharge, skin change or tenderness.     Left: No mass, nipple discharge, skin change or tenderness.  Neck:     Thyroid : No thyromegaly.  Cardiovascular:     Rate and Rhythm: Normal rate and regular rhythm.     Heart sounds: Normal heart sounds. No murmur heard. Pulmonary:     Effort: Pulmonary effort is normal.     Breath sounds: Normal breath sounds.  Abdominal:     Palpations: Abdomen is soft.     Tenderness: There is no abdominal tenderness. There is no guarding or rebound.  Musculoskeletal:        General: Normal range of motion.     Cervical back: Normal range of motion.  Lymphadenopathy:     Cervical: No cervical adenopathy.  Neurological:     General: No focal deficit present.     Mental Status: She is alert and oriented to person, place, and time.     Cranial Nerves: No cranial nerve deficit.  Skin:    General: Skin is warm and dry.  Psychiatric:        Mood and Affect: Mood normal.        Behavior: Behavior normal.        Thought Content: Thought content normal.        Judgment: Judgment normal.  Vitals reviewed.    Assessment/Plan:  Encounter for annual routine gynecological examination  Encounter for screening mammogram for malignant neoplasm of breast - Plan: MM 3D SCREENING MAMMOGRAM BILATERAL BREAST; pt to schedule mammo  Screening for colon cancer--pt to call [facility] for f/u colonoscopy 3/25. Will do ref prn.   Vaginal atrophy--try coconut oil as lubricant; discussed vag ERT. Pt to f/u if desires.          GYN counsel breast self exam, mammography screening, menopause, adequate intake of calcium and vitamin D , diet and exercise    F/U  No follow-ups on file.  Jazzmen Restivo B. Lanisa Ishler, PA-C  10/05/2024 5:15 PM

## 2024-10-06 ENCOUNTER — Ambulatory Visit: Admitting: Obstetrics and Gynecology

## 2024-10-06 ENCOUNTER — Encounter: Payer: Self-pay | Admitting: Obstetrics and Gynecology

## 2024-10-06 VITALS — BP 143/73 | HR 78 | Ht 66.0 in | Wt 172.0 lb

## 2024-10-06 DIAGNOSIS — Z01419 Encounter for gynecological examination (general) (routine) without abnormal findings: Secondary | ICD-10-CM

## 2024-10-06 DIAGNOSIS — N952 Postmenopausal atrophic vaginitis: Secondary | ICD-10-CM | POA: Diagnosis not present

## 2024-10-06 DIAGNOSIS — Z90710 Acquired absence of both cervix and uterus: Secondary | ICD-10-CM

## 2024-10-06 DIAGNOSIS — Z01411 Encounter for gynecological examination (general) (routine) with abnormal findings: Secondary | ICD-10-CM

## 2024-10-06 DIAGNOSIS — Z1231 Encounter for screening mammogram for malignant neoplasm of breast: Secondary | ICD-10-CM

## 2024-10-06 NOTE — Patient Instructions (Addendum)
 I value your feedback and you entrusting [place] with your care. If you get a [facility] patient survey, I would appreciate you taking the time to let us know about your experience today. Thank you!  [facility] ([place])--[phone]

## 2024-10-16 DIAGNOSIS — E1169 Type 2 diabetes mellitus with other specified complication: Secondary | ICD-10-CM | POA: Diagnosis not present

## 2024-10-16 DIAGNOSIS — E559 Vitamin D deficiency, unspecified: Secondary | ICD-10-CM | POA: Diagnosis not present

## 2024-10-16 DIAGNOSIS — E1159 Type 2 diabetes mellitus with other circulatory complications: Secondary | ICD-10-CM | POA: Diagnosis not present

## 2024-10-16 DIAGNOSIS — E1165 Type 2 diabetes mellitus with hyperglycemia: Secondary | ICD-10-CM | POA: Diagnosis not present

## 2024-11-11 ENCOUNTER — Ambulatory Visit
Admission: RE | Admit: 2024-11-11 | Discharge: 2024-11-11 | Disposition: A | Discharge status: Home / Self Care | Source: Ambulatory Visit | Attending: Obstetrics and Gynecology | Admitting: Obstetrics and Gynecology

## 2024-11-11 DIAGNOSIS — Z1231 Encounter for screening mammogram for malignant neoplasm of breast: Secondary | ICD-10-CM | POA: Insufficient documentation

## 2024-11-14 ENCOUNTER — Ambulatory Visit: Payer: Self-pay | Admitting: Obstetrics and Gynecology
